# Patient Record
Sex: Female | Born: 1965 | Race: Black or African American | Hispanic: No | Marital: Single | Smoking: Former smoker
Health system: Southern US, Community
[De-identification: ages and names within clinical notes are randomized; demographics above are authoritative.]

## PROBLEM LIST (undated history)

## (undated) DIAGNOSIS — R7303 Prediabetes: Secondary | ICD-10-CM

## (undated) DIAGNOSIS — I82890 Acute embolism and thrombosis of other specified veins: Secondary | ICD-10-CM

## (undated) DIAGNOSIS — J45909 Unspecified asthma, uncomplicated: Secondary | ICD-10-CM

## (undated) DIAGNOSIS — T7840XA Allergy, unspecified, initial encounter: Secondary | ICD-10-CM

## (undated) DIAGNOSIS — E559 Vitamin D deficiency, unspecified: Secondary | ICD-10-CM

## (undated) DIAGNOSIS — O2230 Deep phlebothrombosis in pregnancy, unspecified trimester: Secondary | ICD-10-CM

## (undated) DIAGNOSIS — R0683 Snoring: Secondary | ICD-10-CM

## (undated) DIAGNOSIS — Z9289 Personal history of other medical treatment: Secondary | ICD-10-CM

## (undated) DIAGNOSIS — I82409 Acute embolism and thrombosis of unspecified deep veins of unspecified lower extremity: Secondary | ICD-10-CM

## (undated) DIAGNOSIS — E663 Overweight: Secondary | ICD-10-CM

## (undated) DIAGNOSIS — Z8619 Personal history of other infectious and parasitic diseases: Secondary | ICD-10-CM

## (undated) HISTORY — DX: Acute embolism and thrombosis of unspecified deep veins of unspecified lower extremity: I82.409

## (undated) HISTORY — DX: Vitamin D deficiency, unspecified: E55.9

## (undated) HISTORY — PX: TUBAL LIGATION: SHX77

## (undated) HISTORY — DX: Overweight: E66.3

## (undated) HISTORY — DX: Allergy, unspecified, initial encounter: T78.40XA

## (undated) HISTORY — DX: Personal history of other medical treatment: Z92.89

## (undated) HISTORY — DX: Personal history of other infectious and parasitic diseases: Z86.19

## (undated) HISTORY — DX: Deep phlebothrombosis in pregnancy, unspecified trimester: O22.30

## (undated) HISTORY — DX: Unspecified asthma, uncomplicated: J45.909

## (undated) HISTORY — DX: Prediabetes: R73.03

## (undated) HISTORY — DX: Acute embolism and thrombosis of other specified veins: I82.890

## (undated) HISTORY — DX: Snoring: R06.83

---

## 1996-08-01 DIAGNOSIS — O2230 Deep phlebothrombosis in pregnancy, unspecified trimester: Secondary | ICD-10-CM

## 1996-08-01 HISTORY — DX: Deep phlebothrombosis in pregnancy, unspecified trimester: O22.30

## 2007-08-02 LAB — HM MAMMOGRAPHY: HM MAMMO: NORMAL

## 2012-08-03 LAB — LIPID PANEL
Cholesterol: 154 mg/dL (ref 0–200)
HDL: 51 mg/dL (ref 35–70)
LDL CALC: 92 mg/dL
TRIGLYCERIDES: 53 mg/dL (ref 40–160)

## 2012-08-03 LAB — HM PAP SMEAR: HM PAP: NORMAL

## 2014-03-01 DIAGNOSIS — I82409 Acute embolism and thrombosis of unspecified deep veins of unspecified lower extremity: Secondary | ICD-10-CM

## 2014-03-01 HISTORY — DX: Acute embolism and thrombosis of unspecified deep veins of unspecified lower extremity: I82.409

## 2014-03-21 ENCOUNTER — Emergency Department: Payer: Self-pay | Admitting: Student

## 2014-03-21 LAB — CBC
HCT: 40.5 % (ref 35.0–47.0)
HGB: 13 g/dL (ref 12.0–16.0)
MCH: 29.5 pg (ref 26.0–34.0)
MCHC: 32.1 g/dL (ref 32.0–36.0)
MCV: 92 fL (ref 80–100)
PLATELETS: 159 10*3/uL (ref 150–440)
RBC: 4.4 10*6/uL (ref 3.80–5.20)
RDW: 13.1 % (ref 11.5–14.5)
WBC: 6.2 10*3/uL (ref 3.6–11.0)

## 2014-03-21 LAB — APTT: Activated PTT: 23.1 secs — ABNORMAL LOW (ref 23.6–35.9)

## 2014-03-21 LAB — PROTIME-INR
INR: 1
Prothrombin Time: 12.9 secs (ref 11.5–14.7)

## 2014-03-28 ENCOUNTER — Ambulatory Visit: Payer: Self-pay | Admitting: Vascular Surgery

## 2014-03-28 LAB — BUN: BUN: 14 mg/dL (ref 7–18)

## 2014-03-28 LAB — CREATININE, SERUM
Creatinine: 0.84 mg/dL (ref 0.60–1.30)
EGFR (Non-African Amer.): >60

## 2014-03-28 LAB — PROTIME-INR
INR: 1.1
Prothrombin Time: 13.8 secs (ref 11.5–14.7)

## 2014-04-01 DIAGNOSIS — Z9289 Personal history of other medical treatment: Secondary | ICD-10-CM

## 2014-04-01 HISTORY — DX: Personal history of other medical treatment: Z92.89

## 2014-04-01 HISTORY — PX: INSERTION OF VENA CAVA FILTER: SHX5871

## 2014-04-24 ENCOUNTER — Ambulatory Visit: Payer: Self-pay | Admitting: Internal Medicine

## 2014-05-01 ENCOUNTER — Ambulatory Visit: Payer: Self-pay | Admitting: Internal Medicine

## 2014-07-09 ENCOUNTER — Ambulatory Visit: Payer: Self-pay | Admitting: Internal Medicine

## 2014-07-09 LAB — HEPATIC FUNCTION PANEL A (ARMC)
ALBUMIN: 3.7 g/dL (ref 3.4–5.0)
ALK PHOS: 60 U/L
ALT: 22 U/L
Bilirubin, Direct: <0.1 mg/dL (ref 0.0–0.2)
Bilirubin,Total: 0.2 mg/dL (ref 0.2–1.0)
SGOT(AST): 24 U/L (ref 15–37)
TOTAL PROTEIN: 7.4 g/dL (ref 6.4–8.2)

## 2014-07-09 LAB — CREATININE, SERUM
CREATININE: 0.84 mg/dL (ref 0.60–1.30)
EGFR (African American): >60
EGFR (Non-African Amer.): >60

## 2014-07-09 LAB — CBC CANCER CENTER
BASOS PCT: 1.1 %
Basophil #: 0.1 x10 3/mm (ref 0.0–0.1)
EOS ABS: 0.1 x10 3/mm (ref 0.0–0.7)
Eosinophil %: 2.3 %
HCT: 38 % (ref 35.0–47.0)
HGB: 11.9 g/dL — AB (ref 12.0–16.0)
LYMPHS PCT: 42.3 %
Lymphocyte #: 2.6 x10 3/mm (ref 1.0–3.6)
MCH: 28.6 pg (ref 26.0–34.0)
MCHC: 31.4 g/dL — ABNORMAL LOW (ref 32.0–36.0)
MCV: 91 fL (ref 80–100)
Monocyte #: 0.5 x10 3/mm (ref 0.2–0.9)
Monocyte %: 8.4 %
NEUTROS PCT: 45.9 %
Neutrophil #: 2.9 x10 3/mm (ref 1.4–6.5)
PLATELETS: 227 x10 3/mm (ref 150–440)
RBC: 4.17 10*6/uL (ref 3.80–5.20)
RDW: 13.5 % (ref 11.5–14.5)
WBC: 6.2 x10 3/mm (ref 3.6–11.0)

## 2014-07-09 LAB — HCG, QUANTITATIVE, PREGNANCY: BETA HCG, QUANT.: 1 m[IU]/mL

## 2014-08-01 ENCOUNTER — Ambulatory Visit: Payer: Self-pay | Admitting: Internal Medicine

## 2014-09-11 ENCOUNTER — Ambulatory Visit: Payer: Self-pay | Admitting: Vascular Surgery

## 2014-09-22 ENCOUNTER — Ambulatory Visit: Payer: Self-pay | Admitting: Internal Medicine

## 2014-11-22 NOTE — Op Note (Signed)
PATIENT NAME:  [name] [name] MR#:  [id number] DATE OF BIRTH:  1965/09/02  DATE OF PROCEDURE:  03/28/2014  PREOPERATIVE DIAGNOSES: 1.  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  2.  History of deep venous thrombosis with pregnancy in the past.   POSTOPERATIVE DIAGNOSES:   1.  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  2.  History of deep venous thrombosis with pregnancy in the past.   PROCEDURES: 1.  Ultrasound guidance for vascular access to right femoral vein.  2.  Placement of inferior vena cava filter, Bard Denali type.  3.  Ultrasound guidance for vascular access to left popliteal vein.  4.  Catheter placement into inferior vena cava from the left popliteal vein.  5.  Left lower extremity venogram and central venogram, and IVC gram.  6.  Catheter directed thrombolysis with 8 mg of t-PA delivered with the AngioJet proxy catheter.  7.  Mechanical rheolytic thrombectomy to the left superficial femoral vein, common femoral vein, external iliac vein, and common iliac vein with AngioJet proxy catheter.  8.  Percutaneous transluminal angioplasty of left common femoral vein, and superficial femoral vein with 10 mm diameter angioplasty balloon.  9.  Percutaneous transluminal angioplasty of left external iliac vein, and common iliac vein with 10 and 12 mm diameter angioplasty balloon.   SURGEON:  Annice NeedyJason S Dew, MD   ANESTHESIA:  Local with moderate conscious sedation.   ESTIMATED BLOOD LOSS:  Minimal.   INDICATION FOR PROCEDURE:  This is a [name], healthy woman with a recent diagnosis of DVT. She was diagnosed less than 1 week ago. She was started on appropriate anticoagulation; however, her symptoms progressed and a repeat ultrasound 3 days ago was shown to show propagation of the DVT now up into the common femoral vein, and likely higher, whereas this has previously not been up to the common femoral vein.  For this reason, she is brought in for aggressive treatment of her venous disease given her [name] age, and limited comorbidities. We will also place an IVC filter due to propagation of the deep vein thrombosis, and the planned placement of thrombolysis and thrombectomy.   DESCRIPTION OF PROCEDURE:  The patient is brought to the vascular suite. Groins were shaved and prepped, and a sterile surgical field was created in the right femoral vein, this was visualized with ultrasound. It was accessed with a mild amount of difficulty with a micropuncture needle and micropuncture wire, and sheath were then placed. We upsized to the delivery sheath [facility] the [facility], this was placed into the inferior vena cava. An inferior venacavogram was performed. This showed a patent vena cava with the level of the renal veins at L1. The filter was then deployed at the top of L2.   I then turned my attention to the thrombolysis thrombectomy of left lower extremity. She was placed in a prone position, her left popliteal vein was visualized with ultrasound, and found to be patent. It was then accessed under ultrasound guidance with a micropuncture needle, and micropuncture wire, and a 6 French sheath was placed. Imaging was performed through this. The popliteal vein was patent up to near the level of Hunter canal where a thrombus was seen in the superficial femoral vein that extended up into the common femoral vein into the iliac veins. We were able to navigate through this with a Kumpe catheter, and a Magic torque wire. 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 There was thrombus in this area as well, but the stenosis was likely part the cause of the situation, 8 mg of t-PA were delivered with the AngioJet proxy catheter throughout the superficial femoral vein, common femoral vein, external iliac vein, and  common iliac vein up into the vena cava. This was allowed to dwell for 20 minutes. Mechanical rheolytic thrombectomy was then performed throughout the same veins which resulted in a marked improvement but not resolution of the thrombus. 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   I then used a 10 mm diameter angioplasty balloon. I used this to treat the common iliac vein. The external iliac vein and then for the residual thrombus and separate stenosis in the common femoral vein and superficial femoral vein it was pulled down, and treated these areas as well, separately; the SFV, and common femoral vein were markedly improved with the 10 mm balloon. There was still narrowing within the iliac veins, and so I upsized to a 12 mm diameter angioplasty balloon. With this, there was significant improvement. The lumen was still narrowed, probably 40% to 50%, but the flow was markedly improved. I elected not to place a stent stay in the acute thrombotic situation, and we will see how her symptoms resolve with anticoagulation. The sheath was removed. Pressure was held. Sterile dressing was placed. The patient tolerated the procedure well, and was taken to the recovery room in stable condition.    ____________________________ Annice Needy, MD jsd:nt D: 03/28/2014 11:38:42 ET T: 03/28/2014 15:03:54 ET JOB#: [id number]  cc: Annice Needy, MD, <Dictator> Annice Needy MD ELECTRONICALLY SIGNED 03/31/2014 11:50

## 2014-11-30 NOTE — Op Note (Signed)
PATIENT NAME:  [name] EgeYOUNG  [name], [name] [name] MR#:  [id number] DATE OF BIRTH:  1966-01-23  DATE OF PROCEDURE:  09/11/2014  PREOPERATIVE DIAGNOSES:  1. Extensive left lower extremity deep vein thrombosis.  2. Status post inferior vena cava filter and venous thrombolysis left lower extremity.   POSTOPERATIVE DIAGNOSIS:  1. Extensive left lower extremity deep vein thrombosis. 2. Status post inferior vena cava filter and venous thrombolysis left lower extremity.  PROCEDURES: 1. Ultrasound guidance for vascular access to right jugular vein.  2. Catheter placement into inferior vena cava from right jugular vein approach.  3. Inferior venacavogram.  4. [facility]al of [facility] IV[facility] filter.   SURGEON: Annice NeedyJason S. Aparna Vanderweele, MD   ANESTHESIA: Local with moderate conscious sedation.   ESTIMATED BLOOD LOSS: Minimal.   FLUOROSCOPY TIME: About 2 minutes and 15 mL of contrast were used.   INDICATION FOR PROCEDURE: A 49 year old female, who we placed a filter as part of a thrombolysis for extensive left lower extremity deep vein thrombosis a couple of months ago. She has done well. Her leg is doing great. She has only chronic-appearing thrombus residual and we are now here to get her filter out. Risks and benefits were discussed. Informed consent was obtained.   DESCRIPTION OF PROCEDURE: The patient is brought to the vascular suite. The right neck was sterilely prepped and draped and a sterile surgical field was created. The right jugular vein was visualized with ultrasound and found to be widely patent. It was then accessed under direct ultrasound guidance without difficulty with Seldinger needle. J-wire and a permanent image was recorded.  A J-wire was then placed. After skin nick and dilatation, the retrieval sheath was placed over the wire into the inferior vena cava. Inferior venacavogram was performed, which showed the cava to be widely patent with filter in good location below the renal veins. The retrieval snare  was then used to snare the hook at the top of the filter without difficulty. The sheath was advanced over the filter. The filter was collapsed, brought out in its entirety through the sheath. The sheath was then removed. Pressure was held. Sterile dressings were placed. The patient tolerated the procedure well and was taken to the recovery room in stable condition.   ____________________________ Annice NeedyJason S. Dechelle Attaway, MD jsd:ap D: 09/11/2014 11:26:25 ET T: 09/11/2014 16:15:36 ET JOB#: [id number]  cc: Annice NeedyJason S. Tanairi Cypert, MD, <Dictator> Annice NeedyJASON S Airyana Sprunger MD ELECTRONICALLY SIGNED 09/24/2014 16:36

## 2015-03-13 ENCOUNTER — Inpatient Hospital Stay: Payer: Self-pay | Attending: Family Medicine | Admitting: Family Medicine

## 2015-03-13 ENCOUNTER — Ambulatory Visit: Payer: Self-pay | Admitting: Family Medicine

## 2015-04-09 ENCOUNTER — Ambulatory Visit: Payer: Self-pay | Admitting: Family Medicine

## 2015-04-21 ENCOUNTER — Encounter: Payer: Self-pay | Admitting: *Deleted

## 2015-04-22 ENCOUNTER — Inpatient Hospital Stay: Payer: Self-pay | Admitting: Internal Medicine

## 2015-04-22 ENCOUNTER — Ambulatory Visit: Payer: Self-pay

## 2015-04-22 ENCOUNTER — Other Ambulatory Visit: Payer: Self-pay

## 2015-04-23 ENCOUNTER — Telehealth: Payer: Self-pay | Admitting: *Deleted

## 2015-04-23 NOTE — Telephone Encounter (Signed)
-----   Message from [name] [name] sent at 04/22/2015  3:17 PM EDT ----- Called patient to r/s missed appt today,and patient stated she did not want to be rescheduled anymore. Stated she came for a one time deal to se Dr Lorre Nick for a blood clot.

## 2015-04-23 NOTE — Telephone Encounter (Signed)
Patient does not want to r/s her appointment.

## 2015-05-11 NOTE — Progress Notes (Signed)
This encounter was created in error - please disregard.

## 2015-07-02 ENCOUNTER — Ambulatory Visit
Admission: RE | Admit: 2015-07-02 | Discharge: 2015-07-02 | Disposition: A | Discharge status: Home / Self Care | Payer: 59 | Source: Ambulatory Visit | Attending: Family Medicine | Admitting: Family Medicine

## 2015-07-02 ENCOUNTER — Encounter: Payer: Self-pay | Admitting: Family Medicine

## 2015-07-02 ENCOUNTER — Other Ambulatory Visit: Payer: Self-pay | Admitting: Family Medicine

## 2015-07-02 ENCOUNTER — Ambulatory Visit (INDEPENDENT_AMBULATORY_CARE_PROVIDER_SITE_OTHER): Payer: 59 | Admitting: Family Medicine

## 2015-07-02 VITALS — BP 140/90 | HR 100 | Temp 98.6°F | Resp 16 | Ht 65.0 in | Wt 166.2 lb

## 2015-07-02 DIAGNOSIS — J4551 Severe persistent asthma with (acute) exacerbation: Secondary | ICD-10-CM | POA: Diagnosis not present

## 2015-07-02 DIAGNOSIS — J4521 Mild intermittent asthma with (acute) exacerbation: Secondary | ICD-10-CM

## 2015-07-02 DIAGNOSIS — J309 Allergic rhinitis, unspecified: Secondary | ICD-10-CM

## 2015-07-02 DIAGNOSIS — J302 Other seasonal allergic rhinitis: Secondary | ICD-10-CM | POA: Insufficient documentation

## 2015-07-02 DIAGNOSIS — Z86718 Personal history of other venous thrombosis and embolism: Secondary | ICD-10-CM | POA: Insufficient documentation

## 2015-07-02 DIAGNOSIS — J3089 Other allergic rhinitis: Secondary | ICD-10-CM

## 2015-07-02 DIAGNOSIS — R072 Precordial pain: Secondary | ICD-10-CM | POA: Diagnosis not present

## 2015-07-02 DIAGNOSIS — E876 Hypokalemia: Secondary | ICD-10-CM

## 2015-07-02 DIAGNOSIS — Z13220 Encounter for screening for lipoid disorders: Secondary | ICD-10-CM | POA: Diagnosis not present

## 2015-07-02 DIAGNOSIS — J45909 Unspecified asthma, uncomplicated: Secondary | ICD-10-CM | POA: Insufficient documentation

## 2015-07-02 DIAGNOSIS — IMO0001 Reserved for inherently not codable concepts without codable children: Secondary | ICD-10-CM

## 2015-07-02 MED ORDER — LORATADINE 10 MG PO TABS: 10.0000 mg | ORAL_TABLET | Freq: Every day | ORAL | Status: AC

## 2015-07-02 MED ORDER — ALBUTEROL SULFATE HFA 108 (90 BASE) MCG/ACT IN AERS: 2.0000 | INHALATION_SPRAY | Freq: Four times a day (QID) | RESPIRATORY_TRACT | Status: DC | PRN

## 2015-07-02 MED ORDER — DEXAMETHASONE SODIUM PHOSPHATE 100 MG/10ML IJ SOLN: 8.0000 mg | Freq: Once | INTRAMUSCULAR | Status: DC

## 2015-07-02 MED ORDER — LEVOFLOXACIN 500 MG PO TABS: 500.0000 mg | ORAL_TABLET | Freq: Every day | ORAL | Status: DC

## 2015-07-02 MED ORDER — MONTELUKAST SODIUM 10 MG PO TABS: 10.0000 mg | ORAL_TABLET | Freq: Every day | ORAL | Status: DC

## 2015-07-02 MED ORDER — FLUTICASONE FUROATE-VILANTEROL 100-25 MCG/INH IN AEPB: 1.0000 | INHALATION_SPRAY | Freq: Every day | RESPIRATORY_TRACT | Status: DC

## 2015-07-02 MED ORDER — POTASSIUM CHLORIDE CRYS ER 20 MEQ PO TBCR: 20.0000 meq | EXTENDED_RELEASE_TABLET | Freq: Every day | ORAL | Status: DC

## 2015-07-02 MED ORDER — PREDNISONE 20 MG PO TABS: 20.0000 mg | ORAL_TABLET | Freq: Two times a day (BID) | ORAL | Status: DC

## 2015-07-02 NOTE — Progress Notes (Signed)
Name: [name] [name]   MRN: [id number]    DOB: [birth year]   Date:07/02/2015       Progress Note  Subjective  Chief Complaint  Chief Complaint  Patient presents with  . Asthma    patient states she had a cold around 06/25/15. Current symptoms cough and trouble breathing. Onset of trouble with breathing started last night.    HPI  Asthma Exacerbation : she developed allergy symptoms 5 days ago when driving climate while [place] from [place].  She states symptoms or mostly sneezing and mild dry cough and post-nasal drainage. Last night she developed left side chest pain/pressure radiating to her left clavicle, SOB, severe coughing spells with wheezing, and chest tightness. She has been using albuterol every 2 hours, with improvement of symptoms, but had to use very two hours.   AR perennial with  Seasonal variation: usually Spring and Fall and caused asthma flare  Patient Active Problem List   Diagnosis Date Noted  . Moderate intermittent asthma with acute exacerbation 07/02/2015  . Allergic rhinitis, seasonal 07/02/2015  . History of DVT of lower extremity     Past Surgical History  Procedure Laterality Date  . Insertion of vena cava filter  04/2014    Dr. Wyn Quakerew   . Tubal ligation      Family History  Problem Relation Age of Onset  . Leukemia Son 3    Social History   Social History  . Marital Status: Single    Spouse Name: N/A  . Number of Children: N/A  . Years of Education: N/A   Occupational History  . Not on file.   Social History Main Topics  . Smoking status: Former Smoker -- 10 years    Types: Cigarettes  . Smokeless tobacco: Not on file  . Alcohol Use: Not on file  . Drug Use: Not on file  . Sexual Activity: Not on file   Other Topics Concern  . Not on file   Social History Narrative     Current outpatient prescriptions:  .  albuterol (PROVENTIL HFA;VENTOLIN HFA) 108 (90 BASE) MCG/ACT inhaler, Inhale 2 puffs into the lungs every 6 (six)  hours as needed for wheezing or shortness of breath., Disp: 1 Inhaler, Rfl: 0 .  Fluticasone Furoate-Vilanterol (BREO ELLIPTA) 100-25 MCG/INH AEPB, Inhale 1 puff into the lungs daily., Disp: 60 each, Rfl: 2 .  loratadine (CLARITIN) 10 MG tablet, Take 1 tablet (10 mg total) by mouth daily., Disp: 30 tablet, Rfl: 5 .  montelukast (SINGULAIR) 10 MG tablet, Take 1 tablet (10 mg total) by mouth at bedtime., Disp: 30 tablet, Rfl: 5  Current facility-administered medications:  .  dexamethasone (DECADRON) injection 8 mg, 8 mg, Intramuscular, Once, Alba CoryKrichna Raenell Mensing, MD  Allergies  Allergen Reactions  . Shellfish Allergy Hives and Itching     ROS  Ten systems reviewed and is negative except as mentioned in HPI   Objective  Filed Vitals:   07/02/15 1001  BP: 140/90  Pulse: 100  Temp: 98.6 F (37 C)  TempSrc: Oral  Resp: 16  Height: 5\' 5"  (1.651 m)  Weight: 166 lb 3.2 oz (75.388 kg)  SpO2: 87%    Body mass index is 27.66 kg/(m^2).  Physical Exam  Constitutional: Patient appears well-developed and well-nourished. Obese  No distress.  HEENT: head atraumatic, normocephalic, pupils equal and reactive to light, ears TM, neck supple, throat within normal limits Cardiovascular: Normal rate, regular rhythm and normal heart sounds.  No murmur heard. No BLE edema.  Pulmonary/Chest: Effort normal, decrease in air movement on left side, that improved after nebulizer therapy, no crackles, no wheezing Abdominal: Soft.  There is no tenderness. Psychiatric: Patient has a normal mood and affect. behavior is normal. Judgment and thought content normal.  PHQ2/9: Depression screen PHQ 2/9 07/02/2015  Decreased Interest 0  Down, Depressed, Hopeless 0  PHQ - 2 Score 0     Fall Risk: Fall Risk  07/02/2015  Falls in the past year? No    Functional Status Survey: Is the patient deaf or have difficulty hearing?: No Does the patient have difficulty seeing, even when wearing glasses/contacts?: Yes  (contacts) Does the patient have difficulty concentrating, remembering, or making decisions?: No Does the patient have difficulty walking or climbing stairs?: No Does the patient have difficulty dressing or bathing?: No Does the patient have difficulty doing errands alone such as visiting a doctor's office or shopping?: No   Assessment & Plan  1. Moderate intermittent asthma with acute exacerbation  - dexamethasone (DECADRON) injection 8 mg; Inject 0.8 mLs (8 mg total) into the muscle once. Report from CXR showed possible infiltration versus mass on right lung field, we will start Levaquin ( patient notified by phone ) and needs to return in one month for repeat CXR - PR DEMO &/OR EVAL,PT USE,AEROSOL DEVICE - DG Chest 2 View; Future - CBC with Differential/Platelet - albuterol (PROVENTIL HFA;VENTOLIN HFA) 108 (90 BASE) MCG/ACT inhaler; Inhale 2 puffs into the lungs every 6 (six) hours as needed for wheezing or shortness of breath.  Dispense: 1 Inhaler; Refill: 0 - montelukast (SINGULAIR) 10 MG tablet; Take 1 tablet (10 mg total) by mouth at bedtime.  Dispense: 30 tablet; Refill: 5 - Fluticasone Furoate-Vilanterol (BREO ELLIPTA) 100-25 MCG/INH AEPB; Inhale 1 puff into the lungs daily.  Dispense: 60 each; Refill: 2 - predniSONE (DELTASONE) 20 MG tablet; Take 1 tablet (20 mg total) by mouth 2 (two) times daily with a meal.  Dispense: 10 tablet; Refill: 0  2. Precordial pain  - EKG 12-Lead - Troponin I - CK total and CKMB (cardiac)not at [facility] - Comprehensive metabolic panel  3. Allergic rhinitis, seasonal  - loratadine (CLARITIN) 10 MG tablet; Take 1 tablet (10 mg total) by mouth daily.  Dispense: 30 tablet; Refill: 5  4. Lipid screening  - Lipid panel

## 2015-07-03 ENCOUNTER — Telehealth: Payer: Self-pay | Admitting: Family Medicine

## 2015-07-03 LAB — CBC WITH DIFFERENTIAL/PLATELET
BASOS: 3 %
Basophils Absolute: 0.2 10*3/uL (ref 0.0–0.2)
EOS (ABSOLUTE): 0.5 10*3/uL — AB (ref 0.0–0.4)
Eos: 9 %
HEMOGLOBIN: 12.2 g/dL (ref 11.1–15.9)
Hematocrit: 36.2 % (ref 34.0–46.6)
IMMATURE GRANS (ABS): 0 10*3/uL (ref 0.0–0.1)
Immature Granulocytes: 0 %
Lymphocytes Absolute: 2 10*3/uL (ref 0.7–3.1)
Lymphs: 38 %
MCH: 29.3 pg (ref 26.6–33.0)
MCHC: 33.7 g/dL (ref 31.5–35.7)
MCV: 87 fL (ref 79–97)
Monocytes Absolute: 0.5 10*3/uL (ref 0.1–0.9)
Monocytes: 9 %
NEUTROS ABS: 2.1 10*3/uL (ref 1.4–7.0)
Neutrophils: 41 %
PLATELETS: 225 10*3/uL (ref 150–379)
RBC: 4.16 x10E6/uL (ref 3.77–5.28)
RDW: 13.3 % (ref 12.3–15.4)
WBC: 5.3 10*3/uL (ref 3.4–10.8)

## 2015-07-03 LAB — COMPREHENSIVE METABOLIC PANEL
ALK PHOS: 51 IU/L (ref 39–117)
ALT: 14 IU/L (ref 0–32)
AST: 20 IU/L (ref 0–40)
Albumin/Globulin Ratio: 1.5 (ref 1.1–2.5)
Albumin: 4.3 g/dL (ref 3.5–5.5)
BUN/Creatinine Ratio: 13 (ref 9–23)
BUN: 11 mg/dL (ref 6–24)
Bilirubin Total: 0.5 mg/dL (ref 0.0–1.2)
CALCIUM: 9.1 mg/dL (ref 8.7–10.2)
CO2: 24 mmol/L (ref 18–29)
CREATININE: 0.86 mg/dL (ref 0.57–1.00)
Chloride: 98 mmol/L (ref 97–106)
GFR calc Af Amer: 92 mL/min/{1.73_m2} (ref >59)
GFR, EST NON AFRICAN AMERICAN: 80 mL/min/{1.73_m2} (ref >59)
GLUCOSE: 89 mg/dL (ref 65–99)
Globulin, Total: 2.8 g/dL (ref 1.5–4.5)
POTASSIUM: 3.2 mmol/L — AB (ref 3.5–5.2)
Sodium: 138 mmol/L (ref 136–144)
Total Protein: 7.1 g/dL (ref 6.0–8.5)

## 2015-07-03 LAB — LIPID PANEL
CHOL/HDL RATIO: 2.1 ratio (ref 0.0–4.4)
Cholesterol, Total: 160 mg/dL (ref 100–199)
HDL: 78 mg/dL (ref >39)
LDL CALC: 73 mg/dL (ref 0–99)
Triglycerides: 43 mg/dL (ref 0–149)
VLDL Cholesterol Cal: 9 mg/dL (ref 5–40)

## 2015-07-03 LAB — CK TOTAL AND CKMB (NOT AT ARMC)
CK TOTAL: 165 U/L (ref 24–173)
CK-MB Index: 2 ng/mL (ref 0.0–5.3)

## 2015-07-03 LAB — TROPONIN I: Troponin I: <0.01 ng/mL (ref 0.00–0.04)

## 2015-07-03 NOTE — Telephone Encounter (Signed)
Patient is requesting to have [facility]. Patient was seen in the office on yesterday and was told if she needed Tussinex to just call the office today.  Please call patient once done.

## 2015-07-04 MED ORDER — BENZONATATE 100 MG PO CAPS: 100.0000 mg | ORAL_CAPSULE | Freq: Three times a day (TID) | ORAL | Status: DC | PRN

## 2015-07-04 NOTE — Telephone Encounter (Signed)
Sent Benzonate, Tussionex is controlled, needs hard copy, can pick it up Monday if she would like

## 2015-07-07 ENCOUNTER — Telehealth: Payer: Self-pay | Admitting: Family Medicine

## 2015-07-07 MED ORDER — HYDROCOD POLST-CPM POLST ER 10-8 MG/5ML PO SUER: 5.0000 mL | Freq: Two times a day (BID) | ORAL | Status: DC | PRN

## 2015-07-07 NOTE — Telephone Encounter (Signed)
Pt called states she does need the Tussonex to be called in. Pt states the pearls do not work for her and was advised to call if she needed the cough syrup. [facility]

## 2015-07-07 NOTE — Telephone Encounter (Signed)
Controlled medication, she needs to come in to pick it up. Printed

## 2015-07-23 ENCOUNTER — Ambulatory Visit (INDEPENDENT_AMBULATORY_CARE_PROVIDER_SITE_OTHER): Payer: 59 | Admitting: Family Medicine

## 2015-07-23 ENCOUNTER — Encounter: Payer: Self-pay | Admitting: Family Medicine

## 2015-07-23 VITALS — BP 114/58 | HR 90 | Temp 99.3°F | Resp 18 | Wt 169.9 lb

## 2015-07-23 DIAGNOSIS — R9389 Abnormal findings on diagnostic imaging of other specified body structures: Secondary | ICD-10-CM

## 2015-07-23 DIAGNOSIS — R938 Abnormal findings on diagnostic imaging of other specified body structures: Secondary | ICD-10-CM

## 2015-07-23 DIAGNOSIS — R059 Cough, unspecified: Secondary | ICD-10-CM

## 2015-07-23 DIAGNOSIS — R05 Cough: Secondary | ICD-10-CM | POA: Diagnosis not present

## 2015-07-23 DIAGNOSIS — R0602 Shortness of breath: Secondary | ICD-10-CM

## 2015-07-23 MED ORDER — AMOXICILLIN-POT CLAVULANATE 875-125 MG PO TABS: 1.0000 | ORAL_TABLET | Freq: Two times a day (BID) | ORAL | Status: DC

## 2015-07-23 MED ORDER — HYDROCOD POLST-CPM POLST ER 10-8 MG/5ML PO SUER: 5.0000 mL | Freq: Two times a day (BID) | ORAL | Status: DC | PRN

## 2015-07-23 NOTE — Progress Notes (Signed)
Name: [name] [name]   MRN: [id number]    DOB: 01-24-1966   Date:07/23/2015       Progress Note  Subjective  Chief Complaint  Chief Complaint  Patient presents with  . Pneumonia    follow-up, patient states still having cough, chest congestion, tightness and coughing up small amounts of blood in phlem    HPI  Cough, SOB and abnormal CXR: she was seen three weeks ago with hypoxemia, a productive cough, she was treated with Levaquin, Breo, symptoms improved significantly but over the past week, started to have worsening of cough again, sometimes sputum has streaks of blood, still feels SOB even at rest but not as severe. She denies fever or chills. Appetite is normal .  Patient Active Problem List   Diagnosis Date Noted  . Moderate intermittent asthma with acute exacerbation 07/02/2015  . Perennial allergic rhinitis with seasonal variation 07/02/2015  . Hypokalemia 07/02/2015  . History of DVT of lower extremity     Past Surgical History  Procedure Laterality Date  . Insertion of vena cava filter  04/2014    Dr. Wyn Quaker   . Tubal ligation      Family History  Problem Relation Age of Onset  . Leukemia Son 3    Social History   Social History  . Marital Status: Single    Spouse Name: N/A  . Number of Children: N/A  . Years of Education: N/A   Occupational History  . Not on file.   Social History Main Topics  . Smoking status: Former Smoker -- 10 years    Types: Cigarettes  . Smokeless tobacco: Not on file  . Alcohol Use: Not on file  . Drug Use: Not on file  . Sexual Activity: Not on file   Other Topics Concern  . Not on file   Social History Narrative     Current outpatient prescriptions:  .  albuterol (PROVENTIL HFA;VENTOLIN HFA) 108 (90 BASE) MCG/ACT inhaler, Inhale 2 puffs into the lungs every 6 (six) hours as needed for wheezing or shortness of breath., Disp: 1 Inhaler, Rfl: 0 .  amoxicillin-clavulanate (AUGMENTIN) 875-125 MG tablet, Take 1 tablet  by mouth 2 (two) times daily., Disp: 20 tablet, Rfl: 0 .  chlorpheniramine-HYDROcodone (TUSSIONEX PENNKINETIC ER) 10-8 MG/5ML SUER, Take 5 mLs by mouth every 12 (twelve) hours as needed for cough., Disp: 140 mL, Rfl: 0 .  Fluticasone Furoate-Vilanterol (BREO ELLIPTA) 100-25 MCG/INH AEPB, Inhale 1 puff into the lungs daily., Disp: 60 each, Rfl: 2 .  loratadine (CLARITIN) 10 MG tablet, Take 1 tablet (10 mg total) by mouth daily., Disp: 30 tablet, Rfl: 5 .  montelukast (SINGULAIR) 10 MG tablet, Take 1 tablet (10 mg total) by mouth at bedtime., Disp: 30 tablet, Rfl: 5 .  potassium chloride SA (K-DUR,KLOR-CON) 20 MEQ tablet, Take 1 tablet (20 mEq total) by mouth daily., Disp: 10 tablet, Rfl: 0  Allergies  Allergen Reactions  . Shellfish Allergy Hives and Itching     ROS  Ten systems reviewed and is negative except as mentioned in HPI   Objective  Filed Vitals:   07/23/15 1555  BP: 114/58  Pulse: 90  Temp: 99.3 F (37.4 C)  TempSrc: Oral  Resp: 18  Weight: 169 lb 14.4 oz (77.066 kg)  SpO2: 97%    Body mass index is 28.27 kg/(m^2).  Physical Exam  Constitutional: Patient appears well-developed and well-nourished. No distress.  HEENT: head atraumatic, normocephalic, pupils equal and reactive to light, ear TM within normal  limits  neck supple, throat within normal limits Cardiovascular: Normal rate, regular rhythm and normal heart sounds.  No murmur heard. No BLE edema. Pulmonary/Chest: Effort normal and breath sounds normal. No respiratory distress. Abdominal: Soft.  There is no tenderness. Psychiatric: Patient has a normal mood and affect. behavior is normal. Judgment and thought content normal.  Recent Results (from the past 2160 hour(s))  Troponin I     Status: None   Collection Time: 07/02/15 11:21 AM  Result Value Ref Range   Troponin I <0.01 0.00 - 0.04 ng/mL  CBC with Differential/Platelet     Status: Abnormal   Collection Time: 07/02/15 11:21 AM  Result Value Ref  Range   WBC 5.3 3.4 - 10.8 x10E3/uL   RBC 4.16 3.77 - 5.28 x10E6/uL   Hemoglobin 12.2 11.1 - 15.9 g/dL   Hematocrit 19.136.2 47.834.0 - 46.6 %   MCV 87 79 - 97 fL   MCH 29.3 26.6 - 33.0 pg   MCHC 33.7 31.5 - 35.7 g/dL   RDW 29.513.3 62.112.3 - 30.815.4 %   Platelets 225 150 - 379 x10E3/uL   Neutrophils 41 %   Lymphs 38 %   Monocytes 9 %   Eos 9 %   Basos 3 %   Neutrophils Absolute 2.1 1.4 - 7.0 x10E3/uL   Lymphocytes Absolute 2.0 0.7 - 3.1 x10E3/uL   Monocytes Absolute 0.5 0.1 - 0.9 x10E3/uL   EOS (ABSOLUTE) 0.5 (H) 0.0 - 0.4 x10E3/uL   Basophils Absolute 0.2 0.0 - 0.2 x10E3/uL   Immature Granulocytes 0 %   Immature Grans (Abs) 0.0 0.0 - 0.1 x10E3/uL  CK total and CKMB (cardiac)not at [facility]     Status: None   Collection Time: 07/02/15 11:21 AM  Result Value Ref Range   Total CK 165 24 - 173 U/L   CK-MB Index 2.0 0.0 - 5.3 ng/mL  Lipid panel     Status: None   Collection Time: 07/02/15 11:21 AM  Result Value Ref Range   Cholesterol, Total 160 100 - 199 mg/dL   Triglycerides 43 0 - 149 mg/dL   HDL 78 >65>39 mg/dL   VLDL Cholesterol Cal 9 5 - 40 mg/dL   LDL Calculated 73 0 - 99 mg/dL   Chol/HDL Ratio 2.1 0.0 - 4.4 ratio units    Comment:                                   T. Chol/HDL Ratio                                             Men  Women                               1/2 Avg.Risk  3.4    3.3                                   Avg.Risk  5.0    4.4                                2X Avg.Risk  9.6    7.1  3X Avg.Risk 23.4   11.0   Comprehensive metabolic panel     Status: Abnormal   Collection Time: 07/02/15 11:21 AM  Result Value Ref Range   Glucose 89 65 - 99 mg/dL   BUN 11 6 - 24 mg/dL   Creatinine, Ser 1.61 0.57 - 1.00 mg/dL   GFR calc non Af Amer 80 >59 mL/min/1.73   GFR calc Af Amer 92 >59 mL/min/1.73   BUN/Creatinine Ratio 13 9 - 23   Sodium 138 136 - 144 mmol/L    Comment: **Effective July 13, 2015 the reference interval**   for Sodium, Serum will  be changing to:                                             134 - 144    Potassium 3.2 (L) 3.5 - 5.2 mmol/L   Chloride 98 97 - 106 mmol/L    Comment: **Effective July 13, 2015 the reference interval**   for Chloride, Serum will be changing to:                                              96 - 106    CO2 24 18 - 29 mmol/L   Calcium 9.1 8.7 - 10.2 mg/dL   Total Protein 7.1 6.0 - 8.5 g/dL   Albumin 4.3 3.5 - 5.5 g/dL   Globulin, Total 2.8 1.5 - 4.5 g/dL   Albumin/Globulin Ratio 1.5 1.1 - 2.5   Bilirubin Total 0.5 0.0 - 1.2 mg/dL   Alkaline Phosphatase 51 39 - 117 IU/L   AST 20 0 - 40 IU/L   ALT 14 0 - 32 IU/L     PHQ2/9: Depression screen PHQ 2/9 07/02/2015  Decreased Interest 0  Down, Depressed, Hopeless 0  PHQ - 2 Score 0     Fall Risk: Fall Risk  07/02/2015  Falls in the past year? No     Assessment & Plan  1. Cough  Possible recurrence of CAP, but she had a possible mass on right lower lung field and cough has been going on for one month approximately we will check CT scan and start antibiotic - chlorpheniramine-HYDROcodone (TUSSIONEX PENNKINETIC ER) 10-8 MG/5ML SUER; Take 5 mLs by mouth every 12 (twelve) hours as needed for cough.  Dispense: 140 mL; Refill: 0 - CT Chest Wo Contrast; Future - amoxicillin-clavulanate (AUGMENTIN) 875-125 MG tablet; Take 1 tablet by mouth 2 (two) times daily.  Dispense: 20 tablet; Refill: 0  2. Abnormal CXR  - CT Chest Wo Contrast; Future  3. SOB (shortness of breath)  - CT Chest Wo Contrast; Future

## 2015-07-31 ENCOUNTER — Ambulatory Visit
Admission: RE | Admit: 2015-07-31 | Discharge: 2015-07-31 | Disposition: A | Discharge status: Home / Self Care | Payer: 59 | Source: Ambulatory Visit | Attending: Family Medicine | Admitting: Family Medicine

## 2015-07-31 DIAGNOSIS — R059 Cough, unspecified: Secondary | ICD-10-CM

## 2015-07-31 DIAGNOSIS — R0602 Shortness of breath: Secondary | ICD-10-CM | POA: Diagnosis present

## 2015-07-31 DIAGNOSIS — R05 Cough: Secondary | ICD-10-CM | POA: Diagnosis present

## 2015-07-31 DIAGNOSIS — R938 Abnormal findings on diagnostic imaging of other specified body structures: Secondary | ICD-10-CM | POA: Insufficient documentation

## 2015-07-31 DIAGNOSIS — R9389 Abnormal findings on diagnostic imaging of other specified body structures: Secondary | ICD-10-CM

## 2015-07-31 IMAGING — CT CT CHEST W/O CM
2 of 3 series · 15 of 36 positions shown, 18 images · non-contrast
Comparison: None; correlation chest radiograph [DATE]

CLINICAL DATA: Diagnosed with pneumonia [DATE], shortness of
breath, could not breathe, improved with antibiotics but now having
shortness of breath and cough again, former smoker, history asthma

EXAM:
CT CHEST WITHOUT CONTRAST
TECHNIQUE: Multidetector CT imaging of the chest was performed following the
standard protocol without IV contrast.

[Series 2: routine chest wo · axial · 0.61mm/px · z∈[-628,-392]mm · 12 of 57 slices shown, 15 images]
[im 5/57  mediastinal]
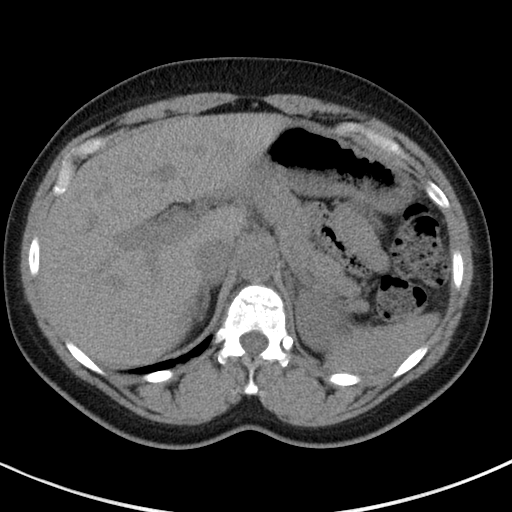
[im 5/57  lung]
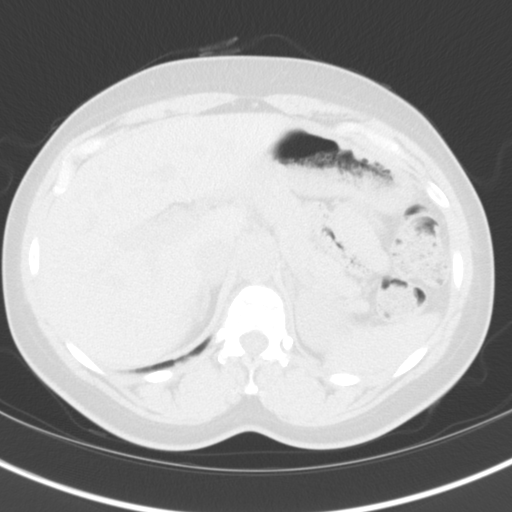
[im 9/57  lung]
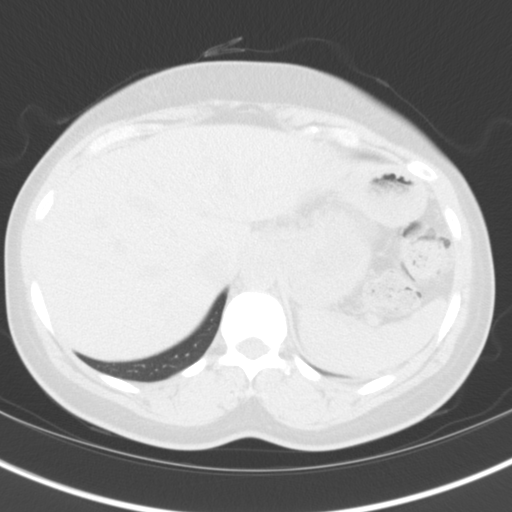
[im 13/57  lung]
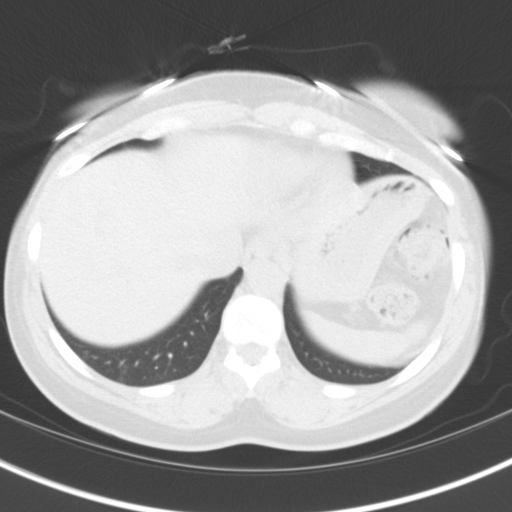
[im 17/57  lung]
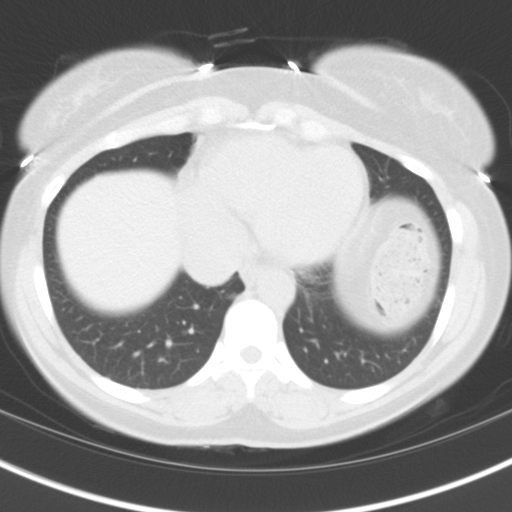
[im 21/57  mediastinal]
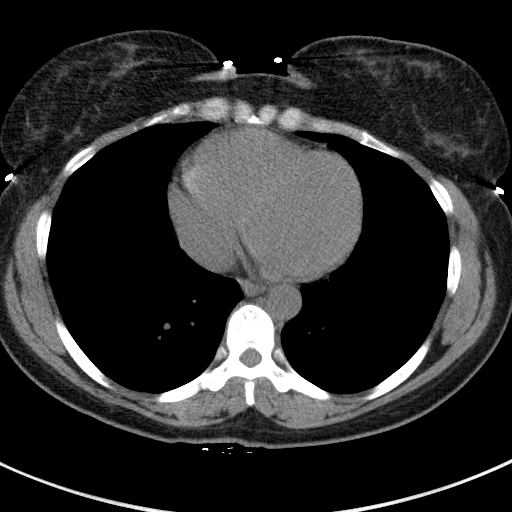
[im 21/57  lung]
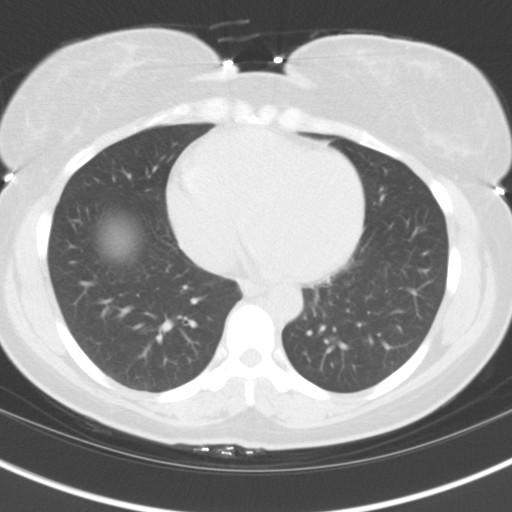
[im 25/57  lung]
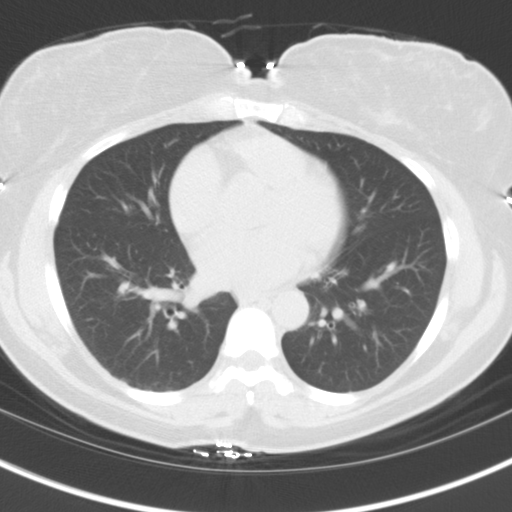
[im 32/57  lung]
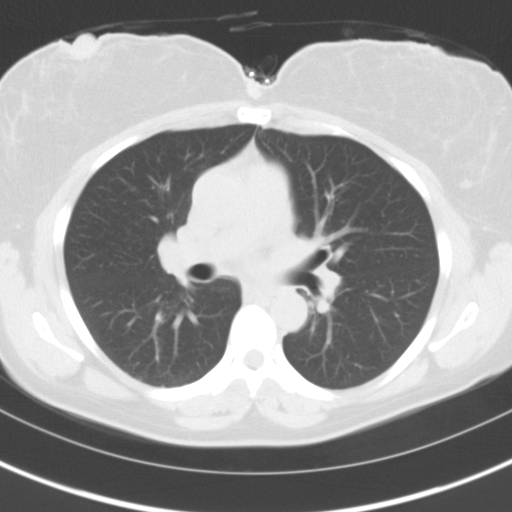
[im 36/57  lung]
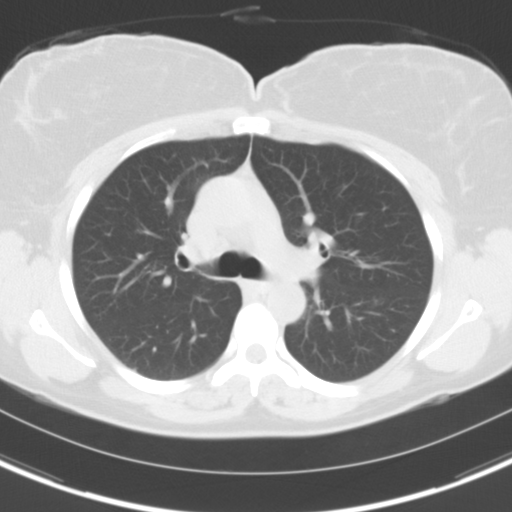
[im 40/57  mediastinal]
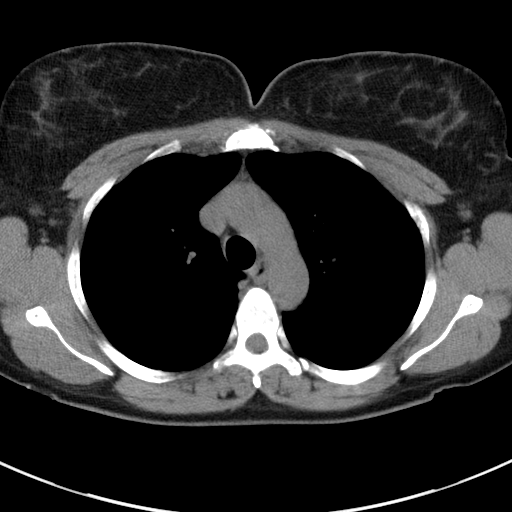
[im 40/57  lung]
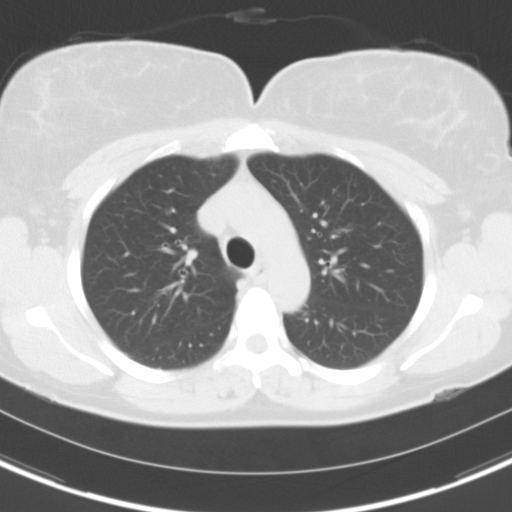
[im 44/57  lung]
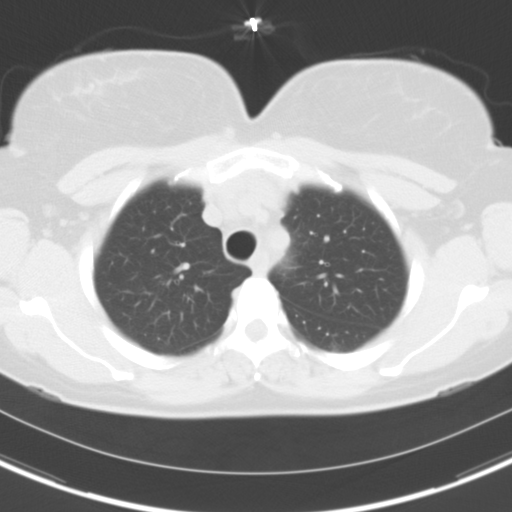
[im 48/57  lung]
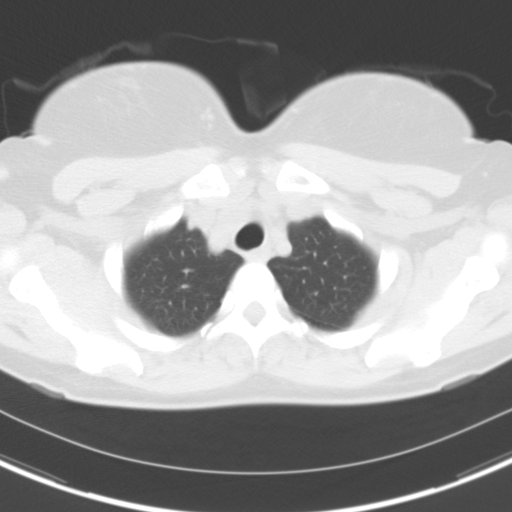
[im 52/57  lung]
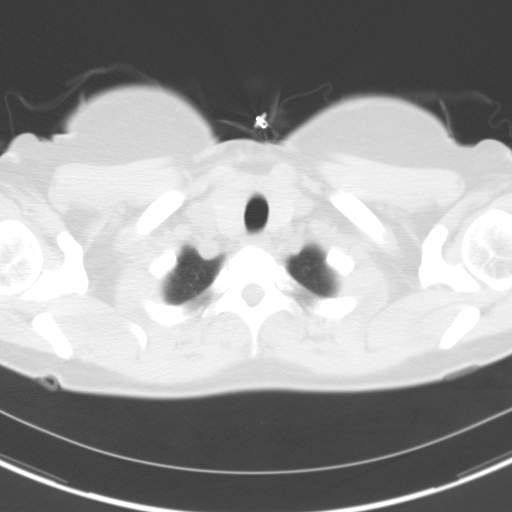

[Series 5: cor routine chest wo · coronal · 0.57mm/px · 3 of 133 slices shown]
[im 27/133  lung]
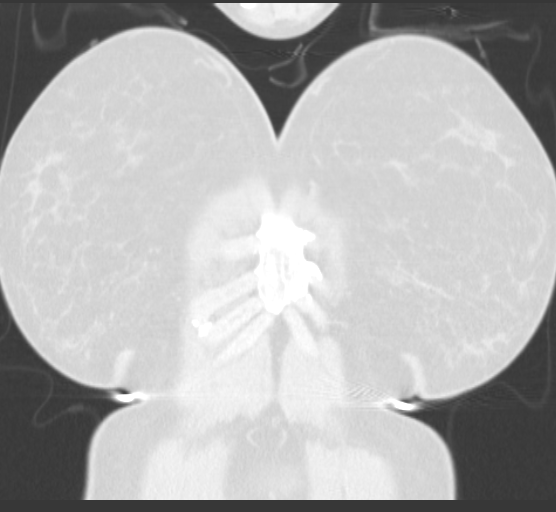
[im 53/133  lung]
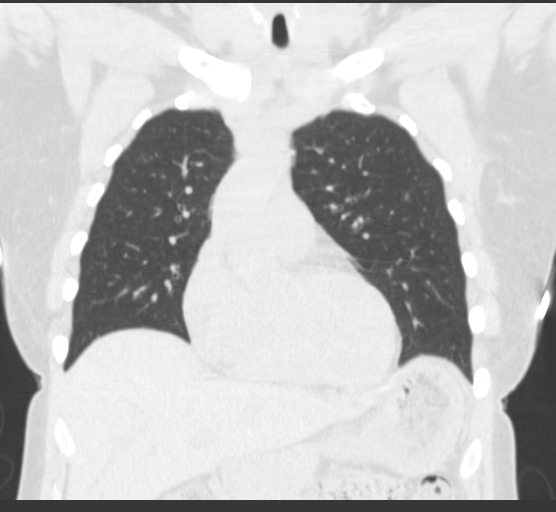
[im 80/133  lung]
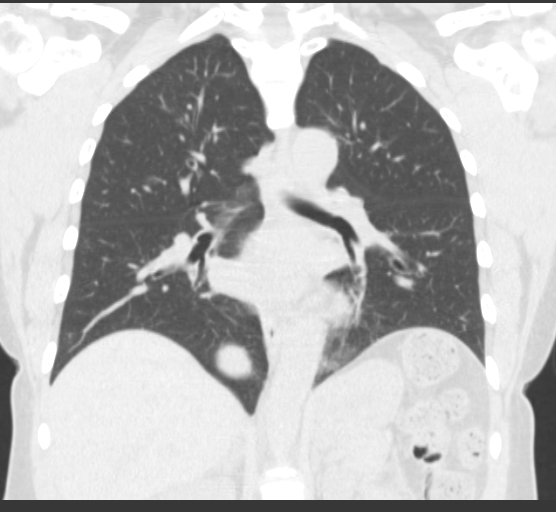

[15 of 36 positions shown; findings below may reference images not displayed]

FINDINGS: Aorta normal caliber.

No thoracic adenopathy by noncontrast CT.

Upper normal size prevascular lymph node 9 mm short axis.

Normal sized 7 mm short axis RIGHT paratracheal lymph node image 21.

Visualized upper abdomen unremarkable for noncontrast technique.

Lungs clear.

No pulmonary infiltrate, pleural effusion, pneumothorax or definite
mass/nodule.

No acute osseous findings.
IMPRESSION: Normal exam.

## 2015-07-31 NOTE — Progress Notes (Signed)
Appointment made

## 2015-08-10 ENCOUNTER — Ambulatory Visit: Payer: 59 | Admitting: Family Medicine

## 2015-08-13 ENCOUNTER — Ambulatory Visit: Payer: 59 | Admitting: Family Medicine

## 2015-08-20 ENCOUNTER — Ambulatory Visit: Payer: 59 | Admitting: Family Medicine

## 2016-10-24 ENCOUNTER — Emergency Department: Payer: 59

## 2016-10-24 ENCOUNTER — Emergency Department
Admission: EM | Admit: 2016-10-24 | Discharge: 2016-10-24 | Disposition: A | Discharge status: Home / Self Care | Payer: 59 | Attending: Emergency Medicine | Admitting: Emergency Medicine

## 2016-10-24 DIAGNOSIS — M7989 Other specified soft tissue disorders: Secondary | ICD-10-CM | POA: Diagnosis present

## 2016-10-24 DIAGNOSIS — I82402 Acute embolism and thrombosis of unspecified deep veins of left lower extremity: Secondary | ICD-10-CM | POA: Diagnosis not present

## 2016-10-24 DIAGNOSIS — Z87891 Personal history of nicotine dependence: Secondary | ICD-10-CM | POA: Insufficient documentation

## 2016-10-24 DIAGNOSIS — I82419 Acute embolism and thrombosis of unspecified femoral vein: Secondary | ICD-10-CM | POA: Diagnosis not present

## 2016-10-24 DIAGNOSIS — J45909 Unspecified asthma, uncomplicated: Secondary | ICD-10-CM | POA: Insufficient documentation

## 2016-10-24 MED ORDER — RIVAROXABAN (XARELTO) VTE STARTER PACK (15 & 20 MG): ORAL_TABLET | ORAL | 0 refills | Status: DC

## 2016-10-24 NOTE — Discharge Instructions (Signed)
Please seek medical attention for any high fevers, chest pain, shortness of breath, change in behavior, persistent vomiting, bloody stool or any other new or concerning symptoms.  

## 2016-10-24 NOTE — ED Triage Notes (Signed)
Pt c/o pain and swelling to the left upper leg for the past 3 days with a hx of DVT with IVC filter.

## 2016-10-24 NOTE — ED Provider Notes (Signed)
[facility] Emergency Department Provider Note   ____________________________________________   I have reviewed the triage vital signs and the nursing notes.   HISTORY  Chief Complaint Leg Swelling   History limited by: Not Limited   HPI [name] [name] is a 51 y.o. female who presents to the emergency department today because of concerns for left leg swelling and pain. Patient states that these symptoms started a few days ago. They have continuously got worse and constant. They did remind the patient of her previous episodes of DVT. Patient is currently not on any anti-coagulants. She did at one time have an IVC filter however that has been removed. Patient states she does travel a significant amount for her work. She denies any chest pain or shortness of breath.   Past Medical History:  Diagnosis Date  . Allergy   . Asthma   . DVT (deep vein thrombosis) in pregnancy (HCC) 1998  . DVT (deep venous thrombosis) (HCC) 03/2014  . History of mammography, screening 04/2014  . History of shingles   . Overweight   . Prediabetes   . Snoring   . Thrombosis of pelvic vein   . Vitamin D deficiency     Patient Active Problem List   Diagnosis Date Noted  . Moderate intermittent asthma with acute exacerbation 07/02/2015  . Perennial allergic rhinitis with seasonal variation 07/02/2015  . Hypokalemia 07/02/2015  . History of DVT of lower extremity     Past Surgical History:  Procedure Laterality Date  . INSERTION OF VENA CAVA FILTER  04/2014   Dr. Wyn Quakerew   . TUBAL LIGATION      Prior to Admission medications   Medication Sig Start Date End Date Taking? Authorizing Provider  albuterol (PROVENTIL HFA;VENTOLIN HFA) 108 (90 BASE) MCG/ACT inhaler Inhale 2 puffs into the lungs every 6 (six) hours as needed for wheezing or shortness of breath. 07/02/15   Alba CoryKrichna Sowles, MD  amoxicillin-clavulanate (AUGMENTIN) 875-125 MG tablet Take 1 tablet by mouth 2 (two)  times daily. 07/23/15   Alba CoryKrichna Sowles, MD  chlorpheniramine-HYDROcodone (TUSSIONEX PENNKINETIC ER) 10-8 MG/5ML SUER Take 5 mLs by mouth every 12 (twelve) hours as needed for cough. 07/23/15   Alba CoryKrichna Sowles, MD  Fluticasone Furoate-Vilanterol (BREO ELLIPTA) 100-25 MCG/INH AEPB Inhale 1 puff into the lungs daily. 07/02/15   Alba CoryKrichna Sowles, MD  loratadine (CLARITIN) 10 MG tablet Take 1 tablet (10 mg total) by mouth daily. 07/02/15   Alba CoryKrichna Sowles, MD  montelukast (SINGULAIR) 10 MG tablet Take 1 tablet (10 mg total) by mouth at bedtime. 07/02/15   Alba CoryKrichna Sowles, MD  potassium chloride SA (K-DUR,KLOR-CON) 20 MEQ tablet Take 1 tablet (20 mEq total) by mouth daily. 07/02/15   Alba CoryKrichna Sowles, MD  Rivaroxaban 15 & 20 MG TBPK Take as directed on package: Start with one 15mg  tablet by mouth twice a day with food. On Day 22, switch to one 20mg  tablet once a day with food. 10/24/16   Phineas SemenGraydon Barack Nicodemus, MD    Allergies Patient has no known allergies.  Family History  Problem Relation Age of Onset  . Leukemia Son 3    Social History Social History  Substance Use Topics  . Smoking status: Former Smoker    Years: 10.00    Types: Cigarettes  . Smokeless tobacco: Never Used  . Alcohol use 0.0 oz/week    Review of Systems  Constitutional: Negative for fever. Cardiovascular: Negative for chest pain. Respiratory: Negative for shortness of breath. Gastrointestinal: Negative for abdominal pain,  vomiting and diarrhea. Musculoskeletal: Left leg pain. Skin: Negative for rash. Neurological: Negative for headaches, focal weakness or numbness.  10-point ROS otherwise negative.  ____________________________________________   PHYSICAL EXAM:  VITAL SIGNS: ED Triage Vitals [10/24/16 1358]  Enc Vitals Group     BP (!) 167/92     Pulse Rate 69     Resp 18     Temp 99.3 F (37.4 C)     Temp Source Oral     SpO2 100 %     Weight 168 lb (76.2 kg)     Height 5\' 5"  (1.651 m)     Head Circumference       Peak Flow      Pain Score 6   Constitutional: Alert and oriented. Well appearing and in no distress. Eyes: Conjunctivae are normal. Normal extraocular movements. ENT   Head: Normocephalic and atraumatic.   Nose: No congestion/rhinnorhea.   Mouth/Throat: Mucous membranes are moist.   Neck: No stridor. Hematological/Lymphatic/Immunilogical: No cervical lymphadenopathy. Cardiovascular: Normal rate, regular rhythm.  No murmurs, rubs, or gallops. Respiratory: Normal respiratory effort without tachypnea nor retractions. Breath sounds are clear and equal bilaterally. No wheezes/rales/rhonchi. Gastrointestinal: Soft and non tender. No rebound. No guarding.  Genitourinary: Deferred Musculoskeletal: Normal range of motion in all extremities. No lower extremity edema. Neurologic:  Normal speech and language. No gross focal neurologic deficits are appreciated.  Skin:  Skin is warm, dry and intact. No rash noted. Psychiatric: Mood and affect are normal. Speech and behavior are normal. Patient exhibits appropriate insight and judgment.  ____________________________________________    LABS (pertinent positives/negatives)  None  ____________________________________________   EKG  None  ____________________________________________    RADIOLOGY  [place] lower extremity IMPRESSION: The study is positive for deep venous thrombosis as detailed above. Preliminary results were given to Fayette Pho, RN, prior to formal interpretation.  ____________________________________________   PROCEDURES  Procedures  ____________________________________________   INITIAL IMPRESSION / ASSESSMENT AND PLAN / ED COURSE  Pertinent labs & imaging results that were available during my care of the patient were reviewed by me and considered in my medical decision making (see chart for details).  Patient presented to the emergency department today because of concerns for left leg pain.  Ultrasound does show DVT. Will place patient on Xarelto. Will have patient follow-up with Dr. Ledell Noss surgery.  ____________________________________________   FINAL CLINICAL IMPRESSION(S) / ED DIAGNOSES  Final diagnoses:  Acute deep vein thrombosis (DVT) of left lower extremity, unspecified vein (HCC)     Note: This dictation was prepared with Dragon dictation. Any transcriptional errors that result from this process are unintentional     Phineas Semen, MD 10/24/16 1538

## 2016-10-27 ENCOUNTER — Other Ambulatory Visit (INDEPENDENT_AMBULATORY_CARE_PROVIDER_SITE_OTHER): Payer: Self-pay | Admitting: Vascular Surgery

## 2016-10-27 ENCOUNTER — Encounter (INDEPENDENT_AMBULATORY_CARE_PROVIDER_SITE_OTHER): Payer: Self-pay | Admitting: Vascular Surgery

## 2016-10-27 ENCOUNTER — Ambulatory Visit (INDEPENDENT_AMBULATORY_CARE_PROVIDER_SITE_OTHER): Payer: 59 | Admitting: Vascular Surgery

## 2016-10-27 VITALS — BP 153/95 | HR 74 | Resp 17 | Wt 166.0 lb

## 2016-10-27 DIAGNOSIS — Z86718 Personal history of other venous thrombosis and embolism: Secondary | ICD-10-CM

## 2016-10-30 NOTE — Progress Notes (Signed)
Subjective:    Patient ID: [name] [name], female    DOB: Dec 12, 1965, 51 y.o.   MRN: [id number] Chief Complaint  Patient presents with  . Follow-up    Patient presents with a chief complaint of "DVT". Patient endorses a history of lower extremity DVT's. She has undergone venous lysis due to extensive DVT in the past. She states DVT's in 1998 and 2016. She has by seen by a hematologist and a work up was negative. She does state she "travels" a lot. She presents today after being seen in the ED and diagnosed with an extensive left lower extremity DVT. She was placed on Xarelto by ED staff. She is experienced left lower extremity swelling and pain. Her symptoms are interfering with her ability to functioning on a daily basis. She is unable to complete her daily activities. Ultrasound on 10/24/16 in the ED with DVT the length of the common femoral vein. Denies any fever, nausea or vomiting. Denies any SOB or chest pain.     Review of Systems  Constitutional: Negative.   HENT: Negative.   Eyes: Negative.   Respiratory: Negative.   Cardiovascular: Positive for leg swelling.       Left Lower Extremity Pain  Gastrointestinal: Negative.   Endocrine: Negative.   Genitourinary: Negative.   Skin: Negative.   Allergic/Immunologic: Negative.   Neurological: Negative.   Hematological: Negative.   Psychiatric/Behavioral: Negative.       Objective:   Physical Exam  Constitutional: She is oriented to person, place, and time. She appears well-developed and well-nourished. No distress.  HENT:  Head: Normocephalic and atraumatic.  Eyes: Conjunctivae are normal. Pupils are equal, round, and reactive to light.  Neck: Normal range of motion.  Cardiovascular: Normal rate, regular rhythm, normal heart sounds and intact distal pulses.   Pulses:      Radial pulses are 2+ on the right side, and 2+ on the left side.       Dorsalis pedis pulses are 2+ on the right side, and 2+ on the left side.      Posterior tibial pulses are 2+ on the right side, and 2+ on the left side.  Left Lower Extremity: Moderate 2+ edema noted. Pain with palpation. Pain with dorsiflexsion. No ulceration.   Pulmonary/Chest: Effort normal.  Musculoskeletal: Normal range of motion. She exhibits edema.  Neurological: She is alert and oriented to person, place, and time.  Skin: Skin is warm and dry. She is not diaphoretic.  Psychiatric: She has a normal mood and affect. Her behavior is normal. Judgment and thought content normal.   BP (!) 153/95   Pulse 74   Resp 17   Wt 166 lb (75.3 kg)   LMP 10/20/2016   BMI 27.62 kg/m   Past Medical History:  Diagnosis Date  . Allergy   . Asthma   . DVT (deep vein thrombosis) in pregnancy (HCC) 1998  . DVT (deep venous thrombosis) (HCC) 03/2014  . History of mammography, screening 04/2014  . History of shingles   . Overweight   . Prediabetes   . Snoring   . Thrombosis of pelvic vein   . Vitamin D deficiency    Social History   Social History  . Marital status: Single    Spouse name: N/A  . Number of children: N/A  . Years of education: N/A   Occupational History  . Not on file.   Social History Main Topics  . Smoking status: Former Smoker    Years:  10.00    Types: Cigarettes  . Smokeless tobacco: Never Used  . Alcohol use 0.0 oz/week  . Drug use: Unknown  . Sexual activity: Not on file   Other Topics Concern  . Not on file   Social History Narrative  . No narrative on file   Past Surgical History:  Procedure Laterality Date  . INSERTION OF VENA CAVA FILTER  04/2014   Dr. Wyn Quaker   . TUBAL LIGATION     Family History  Problem Relation Age of Onset  . Leukemia Son 3   No Known Allergies     Assessment & Plan:   1. History of DVT of lower extremity - New Extensive DVT on duplex performed at [facility] ED.  Symptomatic, unable to perform normal ADL's due to pain. PE with significant swelling and pan. Recommend LLE venous lysis to improve PE and  symptoms. Procedure, risks and benefits explained to patient. All questions answered. Patient wishes to proceed.  Current Outpatient Prescriptions on File Prior to Visit  Medication Sig Dispense Refill  . loratadine (CLARITIN) 10 MG tablet Take 1 tablet (10 mg total) by mouth daily. 30 tablet 5  . Rivaroxaban 15 & 20 MG TBPK Take as directed on package: Start with one  tablet by mouth twice a day with food. On Day 22, switch to one  tablet once a day with food. 51 each 0   No current facility-administered medications on file prior to visit.    There are no Patient Instructions on file for this visit. No Follow-up on file.  KIMBERLY A STEGMAYER, PA-C

## 2016-11-01 ENCOUNTER — Encounter
Admission: RE | Disposition: A | Discharge status: Home / Self Care | Payer: Self-pay | Source: Ambulatory Visit | Attending: Vascular Surgery

## 2016-11-01 ENCOUNTER — Ambulatory Visit
Admission: RE | Admit: 2016-11-01 | Discharge: 2016-11-01 | Disposition: A | Discharge status: Home / Self Care | Payer: 59 | Source: Ambulatory Visit | Attending: Vascular Surgery | Admitting: Vascular Surgery

## 2016-11-01 ENCOUNTER — Encounter: Payer: Self-pay | Admitting: *Deleted

## 2016-11-01 DIAGNOSIS — Z9851 Tubal ligation status: Secondary | ICD-10-CM | POA: Insufficient documentation

## 2016-11-01 DIAGNOSIS — I82402 Acute embolism and thrombosis of unspecified deep veins of left lower extremity: Secondary | ICD-10-CM

## 2016-11-01 DIAGNOSIS — E559 Vitamin D deficiency, unspecified: Secondary | ICD-10-CM | POA: Insufficient documentation

## 2016-11-01 DIAGNOSIS — R0683 Snoring: Secondary | ICD-10-CM | POA: Diagnosis not present

## 2016-11-01 DIAGNOSIS — Z806 Family history of leukemia: Secondary | ICD-10-CM | POA: Insufficient documentation

## 2016-11-01 DIAGNOSIS — Z8619 Personal history of other infectious and parasitic diseases: Secondary | ICD-10-CM | POA: Insufficient documentation

## 2016-11-01 DIAGNOSIS — Z87891 Personal history of nicotine dependence: Secondary | ICD-10-CM | POA: Insufficient documentation

## 2016-11-01 DIAGNOSIS — R7303 Prediabetes: Secondary | ICD-10-CM | POA: Diagnosis not present

## 2016-11-01 DIAGNOSIS — E663 Overweight: Secondary | ICD-10-CM | POA: Insufficient documentation

## 2016-11-01 DIAGNOSIS — Z6827 Body mass index (BMI) 27.0-27.9, adult: Secondary | ICD-10-CM | POA: Diagnosis not present

## 2016-11-01 HISTORY — PX: PERIPHERAL VASCULAR THROMBECTOMY: CATH118306

## 2016-11-01 LAB — CREATININE, SERUM
CREATININE: 0.6 mg/dL (ref 0.44–1.00)
GFR calc Af Amer: >60 mL/min (ref >60)
GFR calc non Af Amer: >60 mL/min (ref >60)

## 2016-11-01 LAB — BUN: BUN: 14 mg/dL (ref 6–20)

## 2016-11-01 SURGERY — PERIPHERAL VASCULAR THROMBECTOMY
Anesthesia: Moderate Sedation

## 2016-11-01 MED ORDER — OXYCODONE-ACETAMINOPHEN 5-325 MG PO TABS
1.0000 | ORAL_TABLET | ORAL | Status: DC | PRN
  Administered 2016-11-01: 2 via ORAL

## 2016-11-01 MED ORDER — SODIUM CHLORIDE 0.9 % IV SOLN
500.0000 mL | Freq: Once | INTRAVENOUS | Status: DC | PRN
Start: 2016-11-01 — End: 2016-11-01

## 2016-11-01 MED ORDER — FENTANYL CITRATE (PF) 100 MCG/2ML IJ SOLN
INTRAMUSCULAR | Status: AC
  Filled 2016-11-01: qty 2

## 2016-11-01 MED ORDER — METOPROLOL TARTRATE 5 MG/5ML IV SOLN: 2.0000 mg | INTRAVENOUS | Status: DC | PRN

## 2016-11-01 MED ORDER — ACETAMINOPHEN 325 MG PO TABS: 325.0000 mg | ORAL_TABLET | ORAL | Status: DC | PRN

## 2016-11-01 MED ORDER — PHENOL 1.4 % MT LIQD
1.0000 | OROMUCOSAL | Status: DC | PRN
  Filled 2016-11-01: qty 177

## 2016-11-01 MED ORDER — IOPAMIDOL (ISOVUE-300) INJECTION 61%
INTRAVENOUS | Status: DC | PRN
Start: 2016-11-01 — End: 2016-11-01
  Administered 2016-11-01: 60 mL via INTRAVENOUS

## 2016-11-01 MED ORDER — SODIUM CHLORIDE 0.9 % IV SOLN
INTRAVENOUS | Status: DC
  Administered 2016-11-01: 13:00:00 via INTRAVENOUS

## 2016-11-01 MED ORDER — HYDROMORPHONE HCL 1 MG/ML IJ SOLN
1.0000 mg | Freq: Once | INTRAMUSCULAR | Status: AC | PRN
  Administered 2016-11-01: 0.5 mg via INTRAVENOUS

## 2016-11-01 MED ORDER — ALTEPLASE 2 MG IJ SOLR
INTRAMUSCULAR | Status: AC
  Filled 2016-11-01: qty 10

## 2016-11-01 MED ORDER — MIDAZOLAM HCL 2 MG/2ML IJ SOLN
INTRAMUSCULAR | Status: DC | PRN
  Administered 2016-11-01 (×3): 2 mg via INTRAVENOUS

## 2016-11-01 MED ORDER — HYDRALAZINE HCL 20 MG/ML IJ SOLN: 5.0000 mg | INTRAMUSCULAR | Status: DC | PRN

## 2016-11-01 MED ORDER — HEPARIN SODIUM (PORCINE) 1000 UNIT/ML IJ SOLN
INTRAMUSCULAR | Status: AC
  Filled 2016-11-01: qty 1

## 2016-11-01 MED ORDER — LIDOCAINE-EPINEPHRINE (PF) 2 %-1:200000 IJ SOLN
INTRAMUSCULAR | Status: AC
  Filled 2016-11-01: qty 20

## 2016-11-01 MED ORDER — MIDAZOLAM HCL 5 MG/5ML IJ SOLN
INTRAMUSCULAR | Status: AC
  Filled 2016-11-01: qty 5

## 2016-11-01 MED ORDER — HYDROMORPHONE HCL 1 MG/ML IJ SOLN
INTRAMUSCULAR | Status: AC
  Administered 2016-11-01: 0.5 mg via INTRAVENOUS
  Filled 2016-11-01: qty 0.5

## 2016-11-01 MED ORDER — MORPHINE SULFATE (PF) 4 MG/ML IV SOLN: 2.0000 mg | INTRAVENOUS | Status: DC | PRN

## 2016-11-01 MED ORDER — ONDANSETRON HCL 4 MG/2ML IJ SOLN: 4.0000 mg | Freq: Four times a day (QID) | INTRAMUSCULAR | Status: DC | PRN

## 2016-11-01 MED ORDER — FENTANYL CITRATE (PF) 100 MCG/2ML IJ SOLN
INTRAMUSCULAR | Status: DC | PRN
  Administered 2016-11-01 (×3): 50 ug via INTRAVENOUS

## 2016-11-01 MED ORDER — GUAIFENESIN-DM 100-10 MG/5ML PO SYRP
15.0000 mL | ORAL_SOLUTION | ORAL | Status: DC | PRN
  Filled 2016-11-01: qty 15

## 2016-11-01 MED ORDER — MIDAZOLAM HCL 2 MG/2ML IJ SOLN
INTRAMUSCULAR | Status: AC
  Filled 2016-11-01: qty 2

## 2016-11-01 MED ORDER — HEPARIN (PORCINE) IN NACL 2-0.9 UNIT/ML-% IJ SOLN
INTRAMUSCULAR | Status: AC
  Filled 2016-11-01: qty 1000

## 2016-11-01 MED ORDER — CEFAZOLIN IN D5W 1 GM/50ML IV SOLN
1.0000 g | Freq: Once | INTRAVENOUS | Status: AC
  Administered 2016-11-01: 1 g via INTRAVENOUS

## 2016-11-01 MED ORDER — LABETALOL HCL 5 MG/ML IV SOLN
10.0000 mg | INTRAVENOUS | Status: DC | PRN
Start: 2016-11-01 — End: 2016-11-01

## 2016-11-01 MED ORDER — FAMOTIDINE 20 MG PO TABS: 40.0000 mg | ORAL_TABLET | ORAL | Status: DC | PRN

## 2016-11-01 MED ORDER — HEPARIN SODIUM (PORCINE) 1000 UNIT/ML IJ SOLN
INTRAMUSCULAR | Status: DC | PRN
  Administered 2016-11-01: 3000 [IU] via INTRAVENOUS

## 2016-11-01 MED ORDER — ALTEPLASE 2 MG IJ SOLR
INTRAMUSCULAR | Status: DC | PRN
  Administered 2016-11-01: 10 mg

## 2016-11-01 MED ORDER — ALUM & MAG HYDROXIDE-SIMETH 200-200-20 MG/5ML PO SUSP: 15.0000 mL | ORAL | Status: DC | PRN

## 2016-11-01 MED ORDER — OXYCODONE-ACETAMINOPHEN 5-325 MG PO TABS
ORAL_TABLET | ORAL | Status: AC
  Administered 2016-11-01: 2 via ORAL
  Filled 2016-11-01: qty 2

## 2016-11-01 MED ORDER — METHYLPREDNISOLONE SODIUM SUCC 125 MG IJ SOLR: 125.0000 mg | INTRAMUSCULAR | Status: DC | PRN

## 2016-11-01 MED ORDER — ACETAMINOPHEN 325 MG RE SUPP
325.0000 mg | RECTAL | Status: DC | PRN
  Filled 2016-11-01: qty 2

## 2016-11-01 SURGICAL SUPPLY — 17 items
BALLN ARMADA 10X80X80 (BALLOONS) ×3
BALLN ARMADA 12X80X80 (BALLOONS) ×3
BALLOON ARMADA 10X80X80 (BALLOONS) ×2 IMPLANT
BALLOON ARMADA 12X80X80 (BALLOONS) ×2 IMPLANT
CANISTER PENUMBRA MAX (MISCELLANEOUS) ×3 IMPLANT
CANNULA 5F STIFF (CANNULA) ×3 IMPLANT
CATH INDIGO D 50CM (CATHETERS) ×3 IMPLANT
CATH VERT 100CM (CATHETERS) ×3 IMPLANT
DEVICE PRESTO INFLATION (MISCELLANEOUS) ×3 IMPLANT
DRAPE BRACHIAL (DRAPES) ×3 IMPLANT
DRAPE TABLE BACK 80X90 (DRAPES) ×3 IMPLANT
FILTER VC CELECT-FEMORAL (Filter) ×3 IMPLANT
GLIDEWIRE ADV .035X260CM (WIRE) ×3 IMPLANT
PACK ANGIOGRAPHY (CUSTOM PROCEDURE TRAY) ×3 IMPLANT
STENT LIFESTAR 14X80X80 (Permanent Stent) ×3 IMPLANT
WIRE J 3MM .035X145CM (WIRE) ×3 IMPLANT
WIRE NITINOL .018 (WIRE) ×3 IMPLANT

## 2016-11-01 NOTE — Progress Notes (Signed)
Patient stable post procedure, vitals stable. Pain improved after prn  Dilaudid given earlier. No bleeding nor hematoma at groin site. Family member at bedside. Discharge teaching done earlier per [facility] R[facility] with return appointment.

## 2016-11-01 NOTE — Op Note (Signed)
[facility]   OPERATIVE NOTE   PRE-OPERATIVE DIAGNOSIS: extensive LLE DVT  POST-OPERATIVE DIAGNOSIS: same   PROCEDURE: 1. US guidance for vascular access to left popliteal vein 2. Catheter placement into IVC from the left popliteal vein approach  3. IVC gram and left lower extremity venogram 4. IVC filter placement 5.   Catheter directed thrombolysis with 10 mg of TPA to the left femoral and iliac veins 6. Mechanical thrombectomy to the left SFV, common femoral vein, and iliac veins 7. PTA of left external and common iliac veins with 10 mm balloon 8. 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  SPECIMEN(S): none  INDICATIONS:  Patient is a 51 y.o. female who presents with recurrent extensive left lower extremity DVT. Patient has marked leg swelling and pain. Venous intervention is performed to reduce the symtpoms and avoid long term postphlebitic symptoms.   DESCRIPTION: After obtaining full informed written consent, the patient was brought back to the vascular suite and placed supine upon the table.Moderate conscious sedation was administered during a face to face encounter with the patient throughout the procedure with my supervision of the RN administering medicines and monitoring the patient's vital signs, pulse oximetry, telemetry and mental status throughout from the start of the procedure until the patient was taken to the recovery room. After  obtaining adequate anesthesia, the patient was prepped and draped in the standard fashion. The right femoral vein was then interrogated with ultrasound and not found to be patent or accessible for filter placement. The patient was then placed in the prone position. The left popliteal vein was accessed under US guidance and found to be widely patent. It was accessed without difficulty and a permanent image was recorded. I then placed the delivery sheath into the IVC. The IVC was patent and the renal veins were at the level of L1. The Cook Celect IVC filter was then deployed at the level of the top of L2.The delivery sheath was removed over an advantage wire and I then upsized to an 8Fr sheath over the advantage wire. 3000 units of heparin were then given. 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 A Kumpe catheter and Advantage wire were then advanced into the CFV and images were performed. This better delineated the May Thurner syndrome with the severe iliac vein stenosis I was able to cross the thrombus and stenosis and advance into the IVC which was patent. I then used the Kumpe catheter and instilled 10 mg of tpa throughout the SFV, CFV, and iliac veins.  After this dwelled for 15 minutes, I used the penumbra CAT D catheter and evacuated about 400 of effluent with mechanical thrombectomy throughout the iliac veins, CFV, and SFV. This had mild to moderate improvement, but the iliac vein stenosis remained the primary problem. I then turned  my attention to the iliac veins. The stenosis/occlusion and thrombus was treated with a 10 mm diameter angioplasty balloon inflated twice.  There was still reasonably high-grade residual stenosis in the 75-80% range over a reasonably long segment of the iliac veins elected to  place a stent there. A 14 mm diameter by 8 cm length self-expanding stent was deployed in the left external and common iliac veins. This was postdilated with a 12 mm balloon with an excellent angiographic result and less than 10% residual stenosis. Another pass with the penumbra CAT D catheter helped evacuate the small amount of residual thrombus in the superficial femoral vein and the common femoral vein. Overall, I would estimate that 80% or more of the initial thrombus was removed with the procedure today. I then elected to terminate the procedure. The sheath was removed and a dressing was placed. She was taken to the recovery room in stable condition having tolerated the procedure well.   COMPLICATIONS: None  CONDITION: Stable  Festus Barren 11/01/2016 3:43 PM

## 2016-11-01 NOTE — H&P (Signed)
[facility] History & Physical Update  The patient was interviewed and re-examined.  The patient's previous History and Physical has been reviewed and is unchanged.  There is no change in the plan of care. We plan to proceed with the scheduled procedure.  Festus Barren, MD  11/01/2016, 1:53 PM

## 2016-11-04 ENCOUNTER — Encounter: Payer: Self-pay | Admitting: Vascular Surgery

## 2016-11-14 ENCOUNTER — Telehealth (INDEPENDENT_AMBULATORY_CARE_PROVIDER_SITE_OTHER): Payer: Self-pay

## 2016-11-14 NOTE — Telephone Encounter (Signed)
Patient called stating that she was having pain in the right leg and was asking for more pain meds.I spoke with KS and she advise for the patient to take tylenol or ibuprofen for the pain; also to elevate and wear compression stockings.At this time the patient should keep the same appointment.

## 2016-11-24 ENCOUNTER — Telehealth (INDEPENDENT_AMBULATORY_CARE_PROVIDER_SITE_OTHER): Payer: Self-pay | Admitting: Vascular Surgery

## 2016-11-24 NOTE — Telephone Encounter (Signed)
NEEDS XARELTO  RX

## 2016-11-28 ENCOUNTER — Other Ambulatory Visit (INDEPENDENT_AMBULATORY_CARE_PROVIDER_SITE_OTHER): Payer: Self-pay

## 2016-12-08 ENCOUNTER — Other Ambulatory Visit (INDEPENDENT_AMBULATORY_CARE_PROVIDER_SITE_OTHER): Payer: Self-pay | Admitting: Vascular Surgery

## 2016-12-08 DIAGNOSIS — I82402 Acute embolism and thrombosis of unspecified deep veins of left lower extremity: Secondary | ICD-10-CM

## 2016-12-12 ENCOUNTER — Encounter (INDEPENDENT_AMBULATORY_CARE_PROVIDER_SITE_OTHER): Payer: Self-pay | Admitting: Vascular Surgery

## 2016-12-12 ENCOUNTER — Ambulatory Visit (INDEPENDENT_AMBULATORY_CARE_PROVIDER_SITE_OTHER): Payer: 59 | Admitting: Vascular Surgery

## 2016-12-12 ENCOUNTER — Ambulatory Visit (INDEPENDENT_AMBULATORY_CARE_PROVIDER_SITE_OTHER): Payer: 59

## 2016-12-12 VITALS — BP 115/68 | HR 68 | Resp 17 | Ht 65.0 in | Wt 175.0 lb

## 2016-12-12 DIAGNOSIS — I82402 Acute embolism and thrombosis of unspecified deep veins of left lower extremity: Secondary | ICD-10-CM

## 2016-12-12 DIAGNOSIS — Z86718 Personal history of other venous thrombosis and embolism: Secondary | ICD-10-CM

## 2016-12-12 NOTE — Progress Notes (Signed)
Subjective:    Patient ID: [name] [name], female    DOB: Dec 31, 1965, 51 y.o.   MRN: [id number] Chief Complaint  Patient presents with  . DVT    follow up   Patient presents for a post-procedure follow up. 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 Patient continues to take Xarelto. Her symptoms have improved. Denies any fever, nausea or vomiting. Denies any chest pain or SOB. Left Lower Extremity Venous Duplex which improvement when compared to previous. 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 Wearing compression and elevating her legs.    Review of Systems  Constitutional: Negative.   HENT: Negative.   Eyes: Negative.   Respiratory: Negative.   Cardiovascular:       Left Lower Extremity Recurrent DVT  Gastrointestinal: Negative.   Endocrine: Negative.   Genitourinary: Negative.   Musculoskeletal: Negative.   Skin: Negative.   Allergic/Immunologic: Negative.   Neurological: Negative.   Hematological: Negative.   Psychiatric/Behavioral: Negative.       Objective:   Physical Exam  Constitutional: She is oriented to person, place, and time. She appears well-developed and well-nourished. No distress.  HENT:  Head: Normocephalic and atraumatic.  Eyes: Conjunctivae are normal. Pupils are equal, round, and reactive to light.  Neck: Normal range of motion.  Cardiovascular: Normal rate, regular rhythm, normal heart sounds and intact distal pulses.   Pulses:      Radial pulses are 2+ on the right side, and 2+ on the left side.       Dorsalis pedis pulses are 2+ on the right side, and 2+ on the left side.       Posterior tibial pulses are 2+ on the right side, and 2+ on the left side.  Pulmonary/Chest: Effort normal.  Musculoskeletal: Normal range of motion. She exhibits no edema.  Left Lower Extremity: No edema. No  pain with palpation.   Neurological: She is alert and oriented to person, place, and time.  Skin: Skin is warm and dry. She is not diaphoretic.  Psychiatric: She has a normal mood and affect. Her behavior is normal. Judgment and thought content normal.  Vitals reviewed.  BP 115/68   Pulse 68   Resp 17   Ht 5\' 5"  (1.651 m)   Wt 175 lb (79.4 kg)   BMI 29.12 kg/m   Past Medical History:  Diagnosis Date  . Allergy   . Asthma   . DVT (deep vein thrombosis) in pregnancy (HCC) 1998  . DVT (deep venous thrombosis) (HCC) 03/2014  . History of mammography, screening 04/2014  . History of shingles   . Overweight   . Prediabetes   . Snoring   . Thrombosis of pelvic vein   . Vitamin D deficiency    Social History   Social History  . Marital status: Single    Spouse name: N/A  . Number of children: N/A  . Years of education: N/A   Occupational History  . Not on file.   Social History Main Topics  . Smoking status: Former Smoker    Years: 10.00    Types: Cigarettes  . Smokeless tobacco: Never Used  . Alcohol use 0.0 oz/week  . Drug use: Unknown  . Sexual activity: Not on file   Other Topics Concern  . Not on file   Social History Narrative  . No  narrative on file    Past Surgical History:  Procedure Laterality Date  . INSERTION OF VENA CAVA FILTER  04/2014   Dr. Wyn Quakerew   . PERIPHERAL VASCULAR THROMBECTOMY Left 11/01/2016   Procedure: Peripheral Vascular Thrombectomy;  Surgeon: Annice NeedyJason S Dew, MD;  Location: [facility] INVASIVE CV LAB;  Service: Cardiovascular;  Laterality: Left;  . TUBAL LIGATION     Family History  Problem Relation Age of Onset  . Leukemia Son 3   No Known Allergies     Assessment & Plan:  Patient presents for a post-procedure follow up. 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 Patient continues to take Xarelto. Her  symptoms have improved. Denies any fever, nausea or vomiting. Denies any chest pain or SOB. Left Lower Extremity Venous Duplex which improvement when compared to previous. 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 Wearing compression and elevating her legs.   1. History of DVT of lower extremity - Improvement Improvement in symptoms, physical exam and ultrasound s/p endovascular procedure on 11/01/16 Will plan on IVC filter removal - Will schedule. Procedure, risks and benefits explained to patient. All questions answered. Patient wishes to proceed.  Patient to continue Xarelto for at least six months due to recurrent and extensive DVT. Patient now with stent. Unable to take ASA - will place on Dr. Driscilla Grammesew's schedule at 6 month mark to discuss future anticoagulation due to recurrent DVT / Stent / Inability to tale ASA Patient is in agreement  - VAS US LOWER EXTREMITY VENOUS (DVT); Future  Current Outpatient Prescriptions on File Prior to Visit  Medication Sig Dispense Refill  . acetaminophen (TYLENOL) 500 MG tablet Take 1,000 mg by mouth 2 (two) times daily as needed for moderate pain.    Marland Kitchen. loratadine (CLARITIN) 10 MG tablet Take 1 tablet (10 mg total) by mouth daily. 30 tablet 5  . Multiple Vitamin (MULTIVITAMIN WITH MINERALS) TABS tablet Take 1 tablet by mouth daily.    Marland Kitchen. OVER THE COUNTER MEDICATION Take 1 capsule by mouth daily. Pine Pollen    . Rivaroxaban 15 & 20 MG TBPK Take as directed on package: Start with one 15mg  tablet by mouth twice a day with food. On Day 22, switch to one 20mg  tablet once a day with food. 51 each 0   No current facility-administered medications on file prior to visit.     There are no Patient Instructions on file for this visit. No Follow-up on file.   Luiza Carranco A Shraga Custard, PA-C

## 2016-12-27 ENCOUNTER — Other Ambulatory Visit (INDEPENDENT_AMBULATORY_CARE_PROVIDER_SITE_OTHER): Payer: Self-pay | Admitting: Vascular Surgery

## 2016-12-28 MED ORDER — CEFAZOLIN SODIUM-DEXTROSE 2-4 GM/100ML-% IV SOLN
2.0000 g | Freq: Once | INTRAVENOUS | Status: AC
  Administered 2016-12-29: 2 g via INTRAVENOUS

## 2016-12-29 ENCOUNTER — Ambulatory Visit
Admission: RE | Admit: 2016-12-29 | Discharge: 2016-12-29 | Disposition: A | Discharge status: Home / Self Care | Payer: 59 | Source: Ambulatory Visit | Attending: Vascular Surgery | Admitting: Vascular Surgery

## 2016-12-29 ENCOUNTER — Encounter
Admission: RE | Disposition: A | Discharge status: Home / Self Care | Payer: Self-pay | Source: Ambulatory Visit | Attending: Vascular Surgery

## 2016-12-29 DIAGNOSIS — Z86718 Personal history of other venous thrombosis and embolism: Secondary | ICD-10-CM | POA: Insufficient documentation

## 2016-12-29 DIAGNOSIS — Z7902 Long term (current) use of antithrombotics/antiplatelets: Secondary | ICD-10-CM | POA: Diagnosis not present

## 2016-12-29 DIAGNOSIS — Z9889 Other specified postprocedural states: Secondary | ICD-10-CM | POA: Diagnosis not present

## 2016-12-29 DIAGNOSIS — Z8619 Personal history of other infectious and parasitic diseases: Secondary | ICD-10-CM | POA: Insufficient documentation

## 2016-12-29 DIAGNOSIS — Z452 Encounter for adjustment and management of vascular access device: Secondary | ICD-10-CM | POA: Diagnosis not present

## 2016-12-29 DIAGNOSIS — I8290 Acute embolism and thrombosis of unspecified vein: Secondary | ICD-10-CM

## 2016-12-29 DIAGNOSIS — R0683 Snoring: Secondary | ICD-10-CM | POA: Insufficient documentation

## 2016-12-29 DIAGNOSIS — Z9851 Tubal ligation status: Secondary | ICD-10-CM | POA: Diagnosis not present

## 2016-12-29 DIAGNOSIS — Z87891 Personal history of nicotine dependence: Secondary | ICD-10-CM | POA: Insufficient documentation

## 2016-12-29 DIAGNOSIS — E663 Overweight: Secondary | ICD-10-CM | POA: Diagnosis not present

## 2016-12-29 DIAGNOSIS — Z6829 Body mass index (BMI) 29.0-29.9, adult: Secondary | ICD-10-CM | POA: Insufficient documentation

## 2016-12-29 DIAGNOSIS — Z806 Family history of leukemia: Secondary | ICD-10-CM | POA: Diagnosis not present

## 2016-12-29 DIAGNOSIS — E559 Vitamin D deficiency, unspecified: Secondary | ICD-10-CM | POA: Insufficient documentation

## 2016-12-29 DIAGNOSIS — R7303 Prediabetes: Secondary | ICD-10-CM | POA: Diagnosis not present

## 2016-12-29 HISTORY — PX: IVC FILTER REMOVAL: CATH118246

## 2016-12-29 SURGERY — IVC FILTER REMOVAL
Anesthesia: Moderate Sedation

## 2016-12-29 MED ORDER — SODIUM CHLORIDE 0.9 % IV SOLN
INTRAVENOUS | Status: DC
  Administered 2016-12-29: 09:00:00 via INTRAVENOUS

## 2016-12-29 MED ORDER — CEFAZOLIN SODIUM-DEXTROSE 2-4 GM/100ML-% IV SOLN
INTRAVENOUS | Status: AC
  Filled 2016-12-29: qty 100

## 2016-12-29 MED ORDER — IOPAMIDOL (ISOVUE-300) INJECTION 61%
INTRAVENOUS | Status: DC | PRN
  Administered 2016-12-29: 15 mL via INTRAVENOUS

## 2016-12-29 MED ORDER — MIDAZOLAM HCL 5 MG/5ML IJ SOLN
INTRAMUSCULAR | Status: AC
  Filled 2016-12-29: qty 5

## 2016-12-29 MED ORDER — ALUM & MAG HYDROXIDE-SIMETH 200-200-20 MG/5ML PO SUSP: 15.0000 mL | ORAL | Status: DC | PRN

## 2016-12-29 MED ORDER — PHENOL 1.4 % MT LIQD: 1.0000 | OROMUCOSAL | Status: DC | PRN

## 2016-12-29 MED ORDER — ONDANSETRON HCL 4 MG/2ML IJ SOLN
4.0000 mg | Freq: Four times a day (QID) | INTRAMUSCULAR | Status: DC | PRN
Start: 2016-12-29 — End: 2016-12-29

## 2016-12-29 MED ORDER — OXYCODONE-ACETAMINOPHEN 5-325 MG PO TABS: 1.0000 | ORAL_TABLET | ORAL | Status: DC | PRN

## 2016-12-29 MED ORDER — HYDRALAZINE HCL 20 MG/ML IJ SOLN: 5.0000 mg | INTRAMUSCULAR | Status: DC | PRN

## 2016-12-29 MED ORDER — FENTANYL CITRATE (PF) 100 MCG/2ML IJ SOLN
50.0000 ug | INTRAMUSCULAR | Status: DC | PRN
Start: 2016-12-29 — End: 2016-12-29
  Administered 2016-12-29: 50 ug via INTRAVENOUS

## 2016-12-29 MED ORDER — ACETAMINOPHEN 325 MG PO TABS: 325.0000 mg | ORAL_TABLET | ORAL | Status: DC | PRN

## 2016-12-29 MED ORDER — FENTANYL CITRATE (PF) 100 MCG/2ML IJ SOLN
INTRAMUSCULAR | Status: AC
  Filled 2016-12-29: qty 2

## 2016-12-29 MED ORDER — MIDAZOLAM HCL 2 MG/2ML IJ SOLN
INTRAMUSCULAR | Status: DC | PRN
  Administered 2016-12-29: 2 mg via INTRAVENOUS

## 2016-12-29 MED ORDER — GUAIFENESIN-DM 100-10 MG/5ML PO SYRP: 15.0000 mL | ORAL_SOLUTION | ORAL | Status: DC | PRN

## 2016-12-29 MED ORDER — LABETALOL HCL 5 MG/ML IV SOLN: 10.0000 mg | INTRAVENOUS | Status: DC | PRN

## 2016-12-29 MED ORDER — ONDANSETRON HCL 4 MG/2ML IJ SOLN: 4.0000 mg | Freq: Four times a day (QID) | INTRAMUSCULAR | Status: DC | PRN

## 2016-12-29 MED ORDER — HYDROMORPHONE HCL 1 MG/ML IJ SOLN: 1.0000 mg | Freq: Once | INTRAMUSCULAR | Status: DC | PRN

## 2016-12-29 MED ORDER — LIDOCAINE-EPINEPHRINE (PF) 2 %-1:200000 IJ SOLN
INTRAMUSCULAR | Status: AC
  Filled 2016-12-29: qty 20

## 2016-12-29 MED ORDER — METOPROLOL TARTRATE 5 MG/5ML IV SOLN: 2.0000 mg | INTRAVENOUS | Status: DC | PRN

## 2016-12-29 MED ORDER — ACETAMINOPHEN 325 MG RE SUPP: 325.0000 mg | RECTAL | Status: DC | PRN

## 2016-12-29 MED ORDER — MORPHINE SULFATE (PF) 4 MG/ML IV SOLN: 2.0000 mg | INTRAVENOUS | Status: DC | PRN

## 2016-12-29 MED ORDER — SODIUM CHLORIDE 0.9 % IV SOLN: 500.0000 mL | Freq: Once | INTRAVENOUS | Status: DC | PRN

## 2016-12-29 SURGICAL SUPPLY — 3 items
PACK ANGIOGRAPHY (CUSTOM PROCEDURE TRAY) ×2 IMPLANT
SET VENACAVA FILTER RETRIEVAL (MISCELLANEOUS) ×2 IMPLANT
TOWEL OR 17X26 4PK STRL BLUE (TOWEL DISPOSABLE) ×2 IMPLANT

## 2016-12-29 NOTE — H&P (Signed)
[facility] History & Physical Update  The patient was interviewed and re-examined.  The patient's previous History and Physical has been reviewed and is unchanged.  There is no change in the plan of care. We plan to proceed with the scheduled procedure.  Festus BarrenJason Azuri Bozard, MD  12/29/2016, 8:53 AM

## 2016-12-29 NOTE — Op Note (Signed)
[facility]   OPERATIVE NOTE    PRE-OPERATIVE DIAGNOSIS:  1. DVT 2. status post IVC filter placement  POST-OPERATIVE DIAGNOSIS: Same as above  PROCEDURE: 1. Ultrasound guidance for vascular access right jugular vein 2. Catheter placement into inferior vena cava from right jugular vein 3. Inferior venacavogram 4. Retrieval of Cook Celect IVC filter  SURGEON: Festus BarrenJason Jaidon Sponsel, MD  ASSISTANT(S): None  ANESTHESIA: Local with moderate conscious sedation for approximately 15 minutes using 2 mg of Versed and 50 mcg of Fentanyl  ESTIMATED BLOOD LOSS: minimal  CONTRAST:  15 cc  FLUORO TIME:  0.7 minutes  FINDING(S): 1. patent IVC  SPECIMEN(S): IVC filter  INDICATIONS:  Patient is a 51 y.o. female who presents with a previous history of IVC filter placement. Patient has tolerated anticoagulation and is well past her thrombectomy procedure and no longer needs this filter. The patient remains on anticoagulation. Risks and benefits were discussed, and informed consent was obtained.  DESCRIPTION: After obtaining full informed written consent, the patient was brought back to the vascular suite and placed supine upon the table.Moderate conscious sedation was administered during a face to face encounter with the patient throughout the procedure with my supervision of the RN administering medicines and monitoring the patient's vital signs, pulse oximetry, telemetry and mental status throughout from the start of the procedure until the patient was taken to the recovery room.  After obtaining adequate anesthesia, the patient was prepped and draped in the standard fashion. The right jugular vein was visualized with ultrasound and found to be widely patent. It was then accessed under direct ultrasound guidance without difficulty with the Seldinger needle and a permanent image was recorded. A J-wire was placed. After skin nick and dilatation, the retrieval sheath was  placed over the wire and advanced into the inferior vena cava. Inferior vena cava was imaged and found to be widely patent on inferior venacavogram. The filter was straight in its orientation. The retrieval snare was then placed through the sheath and the hook of the filter was snared without difficulty. The sheath was then advanced, and the filter was collapsed and brought into the sheath in its entirety. It was then removed from the body in its entirety. The retrieval sheath was then removed. Pressure was held at the access site and sterile dressing was placed. The patient was taken to the recovery room in stable condition having tolerated the procedure well.  COMPLICATIONS: None  CONDITION: Stable   Festus BarrenJason Vishal Sandlin 12/29/2016 10:40 AM  This note was created with Dragon Medical transcription system. Any errors in dictation are purely unintentional.

## 2017-02-07 ENCOUNTER — Telehealth: Payer: Self-pay | Admitting: Family Medicine

## 2017-02-07 DIAGNOSIS — IMO0001 Reserved for inherently not codable concepts without codable children: Secondary | ICD-10-CM

## 2017-02-07 DIAGNOSIS — J4521 Mild intermittent asthma with (acute) exacerbation: Secondary | ICD-10-CM

## 2017-02-07 NOTE — Telephone Encounter (Signed)
Patient requesting refill of Proventil and Breo inhaler to [facility].

## 2017-02-09 NOTE — Telephone Encounter (Signed)
Pt scheduled an appt for Monday.

## 2017-02-09 NOTE — Telephone Encounter (Signed)
lvm for pt to schedule an appointment. Also informed her that we could get her in today.

## 2017-02-13 ENCOUNTER — Ambulatory Visit: Payer: 59 | Admitting: Family Medicine

## 2017-02-22 ENCOUNTER — Emergency Department: Payer: 59

## 2017-02-22 ENCOUNTER — Encounter: Payer: Self-pay | Admitting: Physician Assistant

## 2017-02-22 ENCOUNTER — Emergency Department
Admission: EM | Admit: 2017-02-22 | Discharge: 2017-02-22 | Disposition: A | Discharge status: Home / Self Care | Payer: 59 | Attending: Emergency Medicine | Admitting: Emergency Medicine

## 2017-02-22 DIAGNOSIS — M25511 Pain in right shoulder: Secondary | ICD-10-CM

## 2017-02-22 DIAGNOSIS — S161XXA Strain of muscle, fascia and tendon at neck level, initial encounter: Secondary | ICD-10-CM

## 2017-02-22 DIAGNOSIS — J45909 Unspecified asthma, uncomplicated: Secondary | ICD-10-CM | POA: Diagnosis not present

## 2017-02-22 DIAGNOSIS — Y9389 Activity, other specified: Secondary | ICD-10-CM | POA: Insufficient documentation

## 2017-02-22 DIAGNOSIS — Z79899 Other long term (current) drug therapy: Secondary | ICD-10-CM | POA: Insufficient documentation

## 2017-02-22 DIAGNOSIS — Z87891 Personal history of nicotine dependence: Secondary | ICD-10-CM | POA: Diagnosis not present

## 2017-02-22 DIAGNOSIS — Y92410 Unspecified street and highway as the place of occurrence of the external cause: Secondary | ICD-10-CM | POA: Diagnosis not present

## 2017-02-22 DIAGNOSIS — Y999 Unspecified external cause status: Secondary | ICD-10-CM | POA: Insufficient documentation

## 2017-02-22 DIAGNOSIS — M542 Cervicalgia: Secondary | ICD-10-CM | POA: Diagnosis not present

## 2017-02-22 DIAGNOSIS — S199XXA Unspecified injury of neck, initial encounter: Secondary | ICD-10-CM | POA: Diagnosis not present

## 2017-02-22 MED ORDER — CYCLOBENZAPRINE HCL 5 MG PO TABS: 5.0000 mg | ORAL_TABLET | Freq: Three times a day (TID) | ORAL | 0 refills | Status: DC | PRN

## 2017-02-22 MED ORDER — CYCLOBENZAPRINE HCL 10 MG PO TABS
5.0000 mg | ORAL_TABLET | Freq: Once | ORAL | Status: AC
  Administered 2017-02-22: 5 mg via ORAL
  Filled 2017-02-22: qty 1

## 2017-02-22 MED ORDER — ACETAMINOPHEN 325 MG PO TABS
650.0000 mg | ORAL_TABLET | Freq: Once | ORAL | Status: AC
  Administered 2017-02-22: 650 mg via ORAL
  Filled 2017-02-22: qty 2

## 2017-02-22 MED ORDER — HYDROCODONE-ACETAMINOPHEN 5-325 MG PO TABS: 1.0000 | ORAL_TABLET | Freq: Four times a day (QID) | ORAL | 0 refills | Status: DC | PRN
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
## 2017-04-07 ENCOUNTER — Ambulatory Visit (INDEPENDENT_AMBULATORY_CARE_PROVIDER_SITE_OTHER): Payer: 59 | Admitting: Vascular Surgery

## 2017-04-07 ENCOUNTER — Encounter (INDEPENDENT_AMBULATORY_CARE_PROVIDER_SITE_OTHER): Payer: 59

## 2017-05-12 DIAGNOSIS — M7501 Adhesive capsulitis of right shoulder: Secondary | ICD-10-CM | POA: Insufficient documentation

## 2017-06-27 ENCOUNTER — Telehealth (INDEPENDENT_AMBULATORY_CARE_PROVIDER_SITE_OTHER): Payer: Self-pay | Admitting: Vascular Surgery

## 2017-06-27 ENCOUNTER — Other Ambulatory Visit (INDEPENDENT_AMBULATORY_CARE_PROVIDER_SITE_OTHER): Payer: Self-pay

## 2017-06-27 MED ORDER — XARELTO 20 MG PO TABS: 20.0000 mg | ORAL_TABLET | Freq: Every day | ORAL | 2 refills | Status: DC

## 2017-06-27 MED ORDER — XARELTO 20 MG PO TABS: 20.0000 mg | ORAL_TABLET | Freq: Every day | ORAL | 5 refills | Status: DC

## 2017-06-27 NOTE — Telephone Encounter (Signed)
Refill sent electronically to [facility] mail order today

## 2017-06-27 NOTE — Telephone Encounter (Signed)
New Message   *STAT* If patient is at the pharmacy, call can be transferred to refill team.   1. Which medications need to be refilled? (please list name of each medication and dose if known) xarelto 20 mg tablets daily at supper  2. Which pharmacy/location (including street and city if local pharmacy) is medication to be sent to? [facility]  3. Do they need a 30 day or 90 day supply?  90 day supply

## 2017-09-11 ENCOUNTER — Other Ambulatory Visit: Payer: Self-pay | Admitting: Family Medicine

## 2017-09-11 ENCOUNTER — Ambulatory Visit
Admission: RE | Admit: 2017-09-11 | Discharge: 2017-09-11 | Disposition: A | Discharge status: Home / Self Care | Payer: 59 | Source: Ambulatory Visit | Attending: Family Medicine | Admitting: Family Medicine

## 2017-09-11 ENCOUNTER — Ambulatory Visit: Payer: 59 | Admitting: Family Medicine

## 2017-09-11 ENCOUNTER — Other Ambulatory Visit
Admission: RE | Admit: 2017-09-11 | Discharge: 2017-09-11 | Disposition: A | Discharge status: Home / Self Care | Payer: 59 | Source: Ambulatory Visit | Attending: Family Medicine | Admitting: Family Medicine

## 2017-09-11 ENCOUNTER — Encounter: Payer: Self-pay | Admitting: Family Medicine

## 2017-09-11 VITALS — BP 126/74 | HR 89 | Temp 98.6°F | Resp 16 | Ht 65.0 in | Wt 167.8 lb

## 2017-09-11 DIAGNOSIS — Z7901 Long term (current) use of anticoagulants: Secondary | ICD-10-CM

## 2017-09-11 DIAGNOSIS — E876 Hypokalemia: Secondary | ICD-10-CM

## 2017-09-11 DIAGNOSIS — R079 Chest pain, unspecified: Secondary | ICD-10-CM | POA: Insufficient documentation

## 2017-09-11 DIAGNOSIS — J4520 Mild intermittent asthma, uncomplicated: Secondary | ICD-10-CM | POA: Insufficient documentation

## 2017-09-11 DIAGNOSIS — IMO0001 Reserved for inherently not codable concepts without codable children: Secondary | ICD-10-CM

## 2017-09-11 DIAGNOSIS — I10 Essential (primary) hypertension: Secondary | ICD-10-CM

## 2017-09-11 DIAGNOSIS — Z86718 Personal history of other venous thrombosis and embolism: Secondary | ICD-10-CM

## 2017-09-11 LAB — COMPREHENSIVE METABOLIC PANEL
ALBUMIN: 4.1 g/dL (ref 3.5–5.0)
ALT: 9 U/L — ABNORMAL LOW (ref 14–54)
ANION GAP: 9 (ref 5–15)
AST: 19 U/L (ref 15–41)
Alkaline Phosphatase: 53 U/L (ref 38–126)
BUN: 16 mg/dL (ref 6–20)
CO2: 30 mmol/L (ref 22–32)
Calcium: 9.2 mg/dL (ref 8.9–10.3)
Chloride: 95 mmol/L — ABNORMAL LOW (ref 101–111)
Creatinine, Ser: 0.88 mg/dL (ref 0.44–1.00)
GFR calc Af Amer: >60 mL/min (ref >60)
GFR calc non Af Amer: >60 mL/min (ref >60)
GLUCOSE: 87 mg/dL (ref 65–99)
POTASSIUM: 3 mmol/L — AB (ref 3.5–5.1)
SODIUM: 134 mmol/L — AB (ref 135–145)
Total Bilirubin: 0.5 mg/dL (ref 0.3–1.2)
Total Protein: 7.4 g/dL (ref 6.5–8.1)

## 2017-09-11 LAB — CBC WITH DIFFERENTIAL/PLATELET
Basophils Absolute: 0.1 10*3/uL (ref 0–0.1)
Basophils Relative: 3 %
Eosinophils Absolute: 0.1 10*3/uL (ref 0–0.7)
Eosinophils Relative: 3 %
HEMATOCRIT: 35.9 % (ref 35.0–47.0)
Hemoglobin: 11.5 g/dL — ABNORMAL LOW (ref 12.0–16.0)
LYMPHS PCT: 38 %
Lymphs Abs: 1.7 10*3/uL (ref 1.0–3.6)
MCH: 26.9 pg (ref 26.0–34.0)
MCHC: 32.1 g/dL (ref 32.0–36.0)
MCV: 83.9 fL (ref 80.0–100.0)
MONO ABS: 0.4 10*3/uL (ref 0.2–0.9)
MONOS PCT: 9 %
NEUTROS ABS: 2.2 10*3/uL (ref 1.4–6.5)
Neutrophils Relative %: 47 %
Platelets: 237 10*3/uL (ref 150–440)
RBC: 4.28 MIL/uL (ref 3.80–5.20)
RDW: 15.2 % — AB (ref 11.5–14.5)
WBC: 4.5 10*3/uL (ref 3.6–11.0)

## 2017-09-11 LAB — TROPONIN I: Troponin I: <0.03 ng/mL (ref <0.03)

## 2017-09-11 LAB — FIBRIN DERIVATIVES D-DIMER (ARMC ONLY): Fibrin derivatives D-dimer (ARMC): 124.62 ng/mL (FEU) (ref 0.00–499.00)

## 2017-09-11 MED ORDER — HYDROCHLOROTHIAZIDE 25 MG PO TABS: 25.0000 mg | ORAL_TABLET | Freq: Every day | ORAL | 3 refills | Status: DC

## 2017-09-11 MED ORDER — HYDROCHLOROTHIAZIDE 25 MG PO TABS: 25.0000 mg | ORAL_TABLET | Freq: Every day | ORAL | 0 refills | Status: DC

## 2017-09-11 MED ORDER — POTASSIUM CHLORIDE ER 20 MEQ PO TBCR: 20.0000 meq | EXTENDED_RELEASE_TABLET | Freq: Every day | ORAL | 0 refills | Status: DC

## 2017-09-11 MED ORDER — LOSARTAN POTASSIUM-HCTZ 50-12.5 MG PO TABS: 1.0000 | ORAL_TABLET | Freq: Every day | ORAL | 0 refills | Status: DC

## 2017-09-11 NOTE — Patient Instructions (Addendum)
Go [facility] and have labs and chest Xray performed.  Please keep your cell phone on so that we can call with results.

## 2017-09-11 NOTE — Progress Notes (Signed)
Name: [name] [name]   MRN: [id number]    DOB: [birth year]   Date:09/11/2017       Progress Note  Subjective  Chief Complaint  Chief Complaint  Patient presents with  . Hypertension    [facility], BP was 183/113, 160/97 for 1 week  . Chest Pain    radaites to left arm and shoulder    HPI  Pt presents for Urgent Care Follow Up  HTN: 09/04/2017 she was checking BP at home and noticed BP was elevated - remained elevated the next few days - 160-190's/100-110's.  [facility] [place] on 09/07/17.  Was told she had RIGHT ear infection.  Was given HCTZ 25mg  once daily, augmentin BID, and albuterol inhaler.  She used to take BP medication 4 years ago - went off after she lost weight and got her numbers under control.  She does endorse shortness of breath, but she has been dealing with a respiratory infection.  Endorses some palpitations, intermittent posterior headache.  Denies vision changes, extremity weakness/numbness/tingling, confusion, slurred speech.  Chest Discomfort: She also notes that she has been experiencing chest discomfort since 09/06/2017. LEFT sided chest pain that radiates into the LEFT shoulder and arm - no ECG or chest Xray or labs were performed.  Pain is a dull ache that is constant, worse when she lays down.  She does endorse shortness of breath, but she has been dealing with a respiratory infection.  Also endorses some palpitations - says "I feel like my heart is beating hard" - has used albuterol inhaler maybe twice in the last week.  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 Wyn Quakerew; taking Xarelto 20mg  daily with good compliance.  No history of PE, though she does endorse some shortness of breath as above.  She denies any LE tenderness/pain/swelling/redness.  Patient Active Problem List   Diagnosis Date Noted  . Moderate intermittent asthma with acute exacerbation 07/02/2015  . Perennial allergic rhinitis with seasonal variation  07/02/2015  . Hypokalemia 07/02/2015  . History of DVT of lower extremity     Social History   Tobacco Use  . Smoking status: Former Smoker    Years: 10.00    Types: Cigarettes    Last attempt to quit: 08/01/2008    Years since quitting: 9.1  . Smokeless tobacco: Never Used  Substance Use Topics  . Alcohol use: Yes    Alcohol/week: 0.0 oz     Current Outpatient Medications:  .  acetaminophen (TYLENOL) 500 MG tablet, Take 1,000 mg by mouth 2 (two) times daily as needed for moderate pain., Disp: , Rfl:  .  loratadine (CLARITIN) 10 MG tablet, Take 1 tablet (10 mg total) by mouth daily., Disp: 30 tablet, Rfl: 5 .  Multiple Vitamin (MULTIVITAMIN WITH MINERALS) TABS tablet, Take 1 tablet by mouth daily., Disp: , Rfl:  .  XARELTO 20 MG TABS tablet, Take 1 tablet (20 mg total) by mouth daily with supper., Disp: 90 tablet, Rfl: 2 .  amoxicillin-clavulanate (AUGMENTIN) 875-125 MG tablet, , Disp: , Rfl:  .  cyclobenzaprine (FLEXERIL) 5 MG tablet, Take 1 tablet (5 mg total) by mouth 3 (three) times daily as needed for muscle spasms. (Patient not taking: Reported on 09/11/2017), Disp: 15 tablet, Rfl: 0 .  HYDROcodone-acetaminophen (NORCO) 5-325 MG tablet, Take 1 tablet by mouth every 6 (six) hours as needed. (Patient not taking: Reported on 09/11/2017), Disp: 12 tablet, Rfl: 0  No Known  Allergies  ROS  Ten systems reviewed and is negative except as mentioned in HPI  Objective  Vitals:   09/11/17 1103 09/11/17 1108  BP: (!) 142/76 126/74  Pulse: 89   Resp: 16   Temp: 98.6 F (37 C)   TempSrc: Oral   SpO2: 98%   Weight: 167 lb 12.8 oz (76.1 kg)   Height: 5\' 5"  (1.651 m)    Body mass index is 27.92 kg/m.  Nursing Note and Vital Signs reviewed.  Physical Exam  Constitutional: Patient appears well-developed and well-nourished.  No distress in office, generally well-appearing, pleasant, speaks in full complete sentences without difficulty.  HEENT: head atraumatic, normocephalic,  neck supple without lymphadenopathy, oropharynx pink and moist without exudate Cardiovascular: Normal rate, regular rhythm, S1/S2 present.  No murmur or rub heard. No BLE edema. Pulmonary/Chest: Effort normal and breath sounds clear. No respiratory distress or retractions. Psychiatric: Patient has a normal mood and affect. behavior is normal. Judgment and thought content normal. Musculoskeletal: Normal range of motion, no joint effusions. No gross deformities.  LEFT shoulder is non-tender, left chest is non-tender with no reproducible MSK pain. LE is grossly normal - no swelling, tenderness, or erythema. Neurological: she is alert and oriented to person, place, and time. No cranial nerve deficit. Coordination, balance, strength, speech and gait are normal.  Skin: Skin is warm and dry. No rash noted. No erythema.  Psychiatric: Patient has a normal mood and affect. behavior is normal. Judgment and thought content normal.  No results found for this or any previous visit (from the past 72 hour(s)).  Assessment & Plan  1. Chest pain, unspecified type - EKG 12-Lead - EKG: normal sinus rhythm, no ST changes.  Reviewed with attending MD Dr. Carlynn Purl. - D-Dimer, Quantitative - CBC w/Diff/Platelet - Comprehensive metabolic panel - Troponin I - DG Chest 2 View; Future  2. Hypertension, unspecified type - EKG 12-Lead - hydrochlorothiazide (HYDRODIURIL) 25 MG tablet; Take 1 tablet (25 mg total) by mouth daily.  Dispense: 90 tablet; Refill: 0 - Comprehensive metabolic panel  3. Moderate intermittent asthma without complication - DG Chest 2 View; Future  4. History of DVT of lower extremity - D-Dimer, Quantitative - CBC w/Diff/Platelet - Comprehensive metabolic panel - DG Chest 2 View; Future  5. Anticoagulant long-term use - D-Dimer, Quantitative - CBC w/Diff/Platelet  - We discussed option to present for emergency care vs stat labs and chest xray, pt chooses not to present for emergency care,  and as her vitals are stable, she is well-appearing, and her symptoms have been ongoing and stable for a week, I am comfortable with this plan at this time.  She is advised to go directly to the medical mall for testing; cell phone number verified in demographics.  -Red flags and when to present for emergency care or RTC including fever >101.36F, worsening chest pain or shortness of breath, new/worsening/un-resolving symptoms, calf/LE pain/tenderness/swelling/redness reviewed with patient at time of visit. Follow up and care instructions discussed and provided in AVS.

## 2017-09-18 ENCOUNTER — Ambulatory Visit: Payer: 59 | Admitting: Family Medicine

## 2017-09-18 ENCOUNTER — Encounter: Payer: Self-pay | Admitting: Family Medicine

## 2017-09-18 VITALS — BP 150/100 | HR 80 | Temp 99.1°F | Resp 14 | Ht 65.0 in | Wt 170.5 lb

## 2017-09-18 DIAGNOSIS — E876 Hypokalemia: Secondary | ICD-10-CM

## 2017-09-18 DIAGNOSIS — Z566 Other physical and mental strain related to work: Secondary | ICD-10-CM

## 2017-09-18 DIAGNOSIS — Z23 Encounter for immunization: Secondary | ICD-10-CM | POA: Diagnosis not present

## 2017-09-18 DIAGNOSIS — IMO0001 Reserved for inherently not codable concepts without codable children: Secondary | ICD-10-CM

## 2017-09-18 DIAGNOSIS — I10 Essential (primary) hypertension: Secondary | ICD-10-CM

## 2017-09-18 DIAGNOSIS — J4521 Mild intermittent asthma with (acute) exacerbation: Secondary | ICD-10-CM | POA: Diagnosis not present

## 2017-09-18 MED ORDER — ALBUTEROL SULFATE HFA 108 (90 BASE) MCG/ACT IN AERS: 2.0000 | INHALATION_SPRAY | Freq: Four times a day (QID) | RESPIRATORY_TRACT | 0 refills | Status: DC | PRN

## 2017-09-18 MED ORDER — FLUTICASONE FUROATE-VILANTEROL 100-25 MCG/INH IN AEPB: 1.0000 | INHALATION_SPRAY | Freq: Every day | RESPIRATORY_TRACT | 2 refills | Status: DC

## 2017-09-18 MED ORDER — AMLODIPINE BESYLATE 2.5 MG PO TABS: 2.5000 mg | ORAL_TABLET | Freq: Every day | ORAL | 0 refills | Status: DC

## 2017-09-18 MED ORDER — PREDNISONE 10 MG (21) PO TBPK: ORAL_TABLET | ORAL | 0 refills | Status: DC

## 2017-09-18 NOTE — Progress Notes (Signed)
Name: [name] [name]   MRN: [id number]    DOB: 1965/08/05   Date:09/18/2017       Progress Note  Subjective  Chief Complaint  Chief Complaint  Patient presents with  . Hypertension  . URI    HPI  Asthma: she was seen last week to see Raelyn Ensign, NP. She is only using rescue inhaler more often than usual, coughing daily, she denies wheezing, but has chest tightness and some SOB.   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 Still getting adjusted to the change. She is now on losartan HCTZ but bp is still elevated, 150's/80's at home, bp was even higher before started on medication last week. History of DVT , d-dimer negative.   OM: treated at Urgent care with antibiotic, no ear pain or cold symptoms   Low potassium: we will recheck labs.  Patient Active Problem List   Diagnosis Date Noted  . Hypertension 09/11/2017  . Adhesive capsulitis of right shoulder 05/12/2017  . Moderate intermittent asthma with acute exacerbation 07/02/2015  . Perennial allergic rhinitis with seasonal variation 07/02/2015  . Hypokalemia 07/02/2015  . History of DVT of lower extremity     Past Surgical History:  Procedure Laterality Date  . INSERTION OF VENA CAVA FILTER  04/2014   Dr. Lucky Cowboy   . IVC FILTER REMOVAL N/A 12/29/2016   Procedure: IVC Filter Removal;  Surgeon: Algernon Huxley, MD;  Location: [facility];  Service: Cardiovascular;  Laterality: N/A;  . PERIPHERAL VASCULAR THROMBECTOMY Left 11/01/2016   Procedure: Peripheral Vascular Thrombectomy;  Surgeon: Algernon Huxley, MD;  Location: [facility];  Service: Cardiovascular;  Laterality: Left;  . TUBAL LIGATION      Family History  Problem Relation Age of Onset  . Leukemia Son 3    Social History   Socioeconomic History  . Marital status: Single    Spouse name: Not on file  . Number of children: Not on file  .  Years of education: Not on file  . Highest education level: Not on file  Social Needs  . Financial resource strain: Not on file  . Food insecurity - worry: Not on file  . Food insecurity - inability: Not on file  . Transportation needs - medical: Not on file  . Transportation needs - non-medical: Not on file  Occupational History  . Not on file  Tobacco Use  . Smoking status: Former Smoker    Years: 10.00    Types: Cigarettes    Last attempt to quit: 08/01/2008    Years since quitting: 9.1  . Smokeless tobacco: Never Used  Substance and Sexual Activity  . Alcohol use: Yes    Alcohol/week: 0.0 oz  . Drug use: Not on file  . Sexual activity: Not on file  Other Topics Concern  . Not on file  Social History Narrative  . Not on file     Current Outpatient Medications:  .  acetaminophen (TYLENOL) 500 MG tablet, Take 1,000 mg by mouth 2 (two) times daily as needed for moderate pain., Disp: , Rfl:  .  albuterol (PROAIR HFA) 108 (90 Base) MCG/ACT inhaler, Inhale 2 puffs into the lungs every 6 (six) hours as needed for wheezing or shortness of breath., Disp: 1 Inhaler, Rfl: 0 .  amLODipine (NORVASC) 2.5 MG tablet, Take 1 tablet (2.5 mg total) by  mouth daily., Disp: 30 tablet, Rfl: 0 .  fluticasone furoate-vilanterol (BREO ELLIPTA) 100-25 MCG/INH AEPB, Inhale 1 puff into the lungs daily., Disp: 60 each, Rfl: 2 .  loratadine (CLARITIN) 10 MG tablet, Take 1 tablet (10 mg total) by mouth daily., Disp: 30 tablet, Rfl: 5 .  losartan-hydrochlorothiazide (HYZAAR) 50-12.5 MG tablet, Take 1 tablet by mouth daily., Disp: 30 tablet, Rfl: 0 .  Multiple Vitamin (MULTIVITAMIN WITH MINERALS) TABS tablet, Take 1 tablet by mouth daily., Disp: , Rfl:  .  predniSONE (STERAPRED UNI-PAK 21 TAB) 10 MG (21) TBPK tablet, Take as directed, Disp: 21 tablet, Rfl: 0 .  XARELTO 20 MG TABS tablet, Take 1 tablet (20 mg total) by mouth daily with supper., Disp: 90 tablet, Rfl: 2  No Known  Allergies   ROS  Constitutional: Negative for fever or weight change.  Respiratory: Positive  for cough and shortness of breath.   Cardiovascular: Negative for chest pain ( she has tightness from asthma)  or palpitations.  Gastrointestinal: Negative for abdominal pain, no bowel changes.  Musculoskeletal: Negative for gait problem or joint swelling.  Skin: Negative for rash.  Neurological: Negative for dizziness or headache.  No other specific complaints in a complete review of systems (except as listed in HPI above).  Objective  Vitals:   09/18/17 1128  BP: (!) 150/100  Pulse: 80  Resp: 14  Temp: 99.1 F (37.3 C)  TempSrc: Oral  SpO2: 98%  Weight: 170 lb 8 oz (77.3 kg)  Height: _0  (1.651 m)    Body mass index is 28.37 kg/m.  Physical Exam  Constitutional: Patient appears well-developed and well-nourished. No distress.  HEENT: head atraumatic, normocephalic, pupils equal and reactive to light, ears : normal TM bilateral,  neck supple, throat within normal limits Cardiovascular: Normal rate, regular rhythm and normal heart sounds.  No murmur heard. No BLE edema. Pulmonary/Chest: Effort normal and breath sounds normal. No respiratory distress. Abdominal: Soft.  There is no tenderness. Psychiatric: Patient has a normal mood and affect. behavior is normal. Judgment and thought content normal.  Recent Results (from the past 2160 hour(s))  Troponin I     Status: None   Collection Time: 09/11/17  1:11 PM  Result Value Ref Range   Troponin I <0.03 <0.03 ng/mL    Comment: Performed at [facility], [address] [place],  [address]  Comprehensive metabolic panel     Status: Abnormal   Collection Time: 09/11/17  1:11 PM  Result Value Ref Range   Sodium 134 (L) 135 - 145 mmol/L   Potassium 3.0 (L) 3.5 - 5.1 mmol/L   Chloride 95 (L) 101 - 111 mmol/L   CO2 30 22 - 32 mmol/L   Glucose, Bld 87 65 - 99 mg/dL   BUN 16 6 - 20 mg/dL   Creatinine, Ser 0.88  0.44 - 1.00 mg/dL   Calcium 9.2 8.9 - 10.3 mg/dL   Total Protein 7.4 6.5 - 8.1 g/dL   Albumin 4.1 3.5 - 5.0 g/dL   AST 19 15 - 41 U/L   ALT 9 (L) 14 - 54 U/L   Alkaline Phosphatase 53 38 - 126 U/L   Total Bilirubin 0.5 0.3 - 1.2 mg/dL   GFR calc non Af Amer >60 >60 mL/min   GFR calc Af Amer >60 >60 mL/min    Comment: (NOTE) The eGFR has been calculated using the CKD EPI equation. This calculation has not been validated in all clinical situations. eGFR's persistently <60  mL/min signify possible Chronic Kidney Disease.    Anion gap 9 5 - 15    Comment: Performed at [facility], [address] [place], [place] [address]  CBC with Differential/Platelet     Status: Abnormal   Collection Time: 09/11/17  1:11 PM  Result Value Ref Range   WBC 4.5 3.6 - 11.0 K/uL   RBC 4.28 3.80 - 5.20 MIL/uL   Hemoglobin 11.5 (L) 12.0 - 16.0 g/dL   HCT 35.9 35.0 - 47.0 %   MCV 83.9 80.0 - 100.0 fL   MCH 26.9 26.0 - 34.0 pg   MCHC 32.1 32.0 - 36.0 g/dL   RDW 15.2 (H) 11.5 - 14.5 %   Platelets 237 150 - 440 K/uL   Neutrophils Relative % 47 %   Neutro Abs 2.2 1.4 - 6.5 K/uL   Lymphocytes Relative 38 %   Lymphs Abs 1.7 1.0 - 3.6 K/uL   Monocytes Relative 9 %   Monocytes Absolute 0.4 0.2 - 0.9 K/uL   Eosinophils Relative 3 %   Eosinophils Absolute 0.1 0 - 0.7 K/uL   Basophils Relative 3 %   Basophils Absolute 0.1 0 - 0.1 K/uL    Comment: Performed at [facility], [address] [place], [place] [address]  Fibrin derivatives D-Dimer [facility] only)     Status: None   Collection Time: 09/11/17  1:11 PM  Result Value Ref Range   Fibrin derivatives D-dimer ([facility]) 124.62 0.00 - 499.00 ng/mL (FEU)    Comment: (NOTE) <> Exclusion of Venous Thromboembolism (VTE) - OUTPATIENT ONLY   (Emergency Department or [place])   0-499 ng/ml (FEU): With a low to intermediate pretest probability                      for VTE this test result excludes the diagnosis                      of VTE.   >499  ng/ml (FEU) : VTE not excluded; additional work up for VTE is                      required. <> Testing on Inpatients and Evaluation of Disseminated Intravascular   Coagulation (DIC) Reference Range:   0-499 ng/ml (FEU) Performed at [facility], [address] [place], [place] [address]       PHQ2/9: Depression screen [facility] 2/9 09/11/2017 07/02/2015  Decreased Interest 0 0  Down, Depressed, Hopeless 0 0  PHQ - 2 Score 0 0     Fall Risk: Fall Risk  09/18/2017 09/11/2017 07/02/2015  Falls in the past year? No No No     Functional Status Survey: Is the patient deaf or have difficulty hearing?: No Does the patient have difficulty seeing, even when wearing glasses/contacts?: No Does the patient have difficulty concentrating, remembering, or making decisions?: No Does the patient have difficulty walking or climbing stairs?: No Does the patient have difficulty dressing or bathing?: No Does the patient have difficulty doing errands alone such as visiting a doctor's office or shopping?: No    Assessment & Plan  1. Moderate intermittent asthma with acute exacerbation  - predniSONE (STERAPRED UNI-PAK 21 TAB) 10 MG (21) TBPK tablet; Take as directed  Dispense: 21 tablet; Refill: 0 - fluticasone furoate-vilanterol (BREO ELLIPTA) 100-25 MCG/INH AEPB; Inhale 1 puff into the lungs daily.  Dispense: 60 each; Refill: 2 - albuterol (PROAIR HFA) 108 (90 Base)  MCG/ACT inhaler; Inhale 2 puffs into the lungs every 6 (six) hours as needed for wheezing or shortness of breath.  Dispense: 1 Inhaler; Refill: 0  2. Uncontrolled hypertension  - amLODipine (NORVASC) 2.5 MG tablet; Take 1 tablet (2.5 mg total) by mouth daily.  Dispense: 30 tablet; Refill: 0  3. Hypokalemia  - Potassium  4. Needs flu shot  refused  5. Stress at work  He has a new position , English as a second language teacher

## 2017-09-19 LAB — POTASSIUM: POTASSIUM: 3.3 mmol/L — AB (ref 3.5–5.2)

## 2017-10-08 ENCOUNTER — Other Ambulatory Visit: Payer: Self-pay | Admitting: Family Medicine

## 2017-10-08 DIAGNOSIS — I10 Essential (primary) hypertension: Secondary | ICD-10-CM

## 2017-10-15 ENCOUNTER — Other Ambulatory Visit: Payer: Self-pay | Admitting: Family Medicine

## 2017-10-15 DIAGNOSIS — I10 Essential (primary) hypertension: Secondary | ICD-10-CM

## 2017-10-16 NOTE — Telephone Encounter (Signed)
Refill request for general medication. [facility].   Last office visit: 09/18/2017  Follow up on 11/01/2017

## 2017-10-31 ENCOUNTER — Other Ambulatory Visit: Payer: Self-pay | Admitting: Family Medicine

## 2017-10-31 DIAGNOSIS — I10 Essential (primary) hypertension: Secondary | ICD-10-CM

## 2017-10-31 MED ORDER — AMLODIPINE BESYLATE 2.5 MG PO TABS: 2.5000 mg | ORAL_TABLET | Freq: Every day | ORAL | 0 refills | Status: DC

## 2017-10-31 MED ORDER — LOSARTAN POTASSIUM-HCTZ 50-12.5 MG PO TABS: 1.0000 | ORAL_TABLET | Freq: Every day | ORAL | 0 refills | Status: DC

## 2017-10-31 NOTE — Telephone Encounter (Signed)
Copied from CRM [phone]. Topic: Quick Communication - Rx Refill/Question >> Oct 31, 2017 12:08 PM [name] [name], [name] B wrote: Medication: amLODipine (NORVASC) 2.5 MG tablet [[id number]][[id number]] , losartan-hydrochlorothiazide (HYZAAR) 50-12.5 MG tablet [[id number]][[id number]] ,  Has the patient contacted their pharmacy? Yes.   (Agent: If no, request that the patient contact the pharmacy for the refill.) Preferred Pharmacy (with phone number [facility] street [facility]): [facility] Agent: Please be advised that RX refills may take up to 3 business days. We ask that you follow-up with your pharmacy.

## 2017-10-31 NOTE — Telephone Encounter (Signed)
Hypertension medication request: Amlodipine and Losartan-HCTZ  Last office visit pertaining to hypertension: 09/18/2017   BP Readings from Last 3 Encounters:  09/18/17 (!) 150/100  09/11/17 126/74  02/22/17 (!) 158/97    Lab Results  Component Value Date   CREATININE 0.88 09/11/2017   BUN 16 09/11/2017   NA 134 (L) 09/11/2017   K 3.3 (L) 09/18/2017   CL 95 (L) 09/11/2017   CO2 30 09/11/2017    Follow up on 02/19/2018

## 2017-11-01 ENCOUNTER — Encounter: Payer: 59 | Admitting: Family Medicine

## 2017-11-06 ENCOUNTER — Ambulatory Visit: Payer: 59

## 2017-12-04 ENCOUNTER — Other Ambulatory Visit: Payer: Self-pay | Admitting: Family Medicine

## 2018-02-19 ENCOUNTER — Ambulatory Visit (INDEPENDENT_AMBULATORY_CARE_PROVIDER_SITE_OTHER): Payer: 59 | Admitting: Family Medicine

## 2018-02-19 ENCOUNTER — Encounter

## 2018-02-19 ENCOUNTER — Encounter: Payer: Self-pay | Admitting: Family Medicine

## 2018-02-19 VITALS — BP 144/98 | HR 82 | Temp 98.7°F | Resp 16 | Ht 64.38 in | Wt 170.8 lb

## 2018-02-19 DIAGNOSIS — Z1239 Encounter for other screening for malignant neoplasm of breast: Secondary | ICD-10-CM

## 2018-02-19 DIAGNOSIS — I10 Essential (primary) hypertension: Secondary | ICD-10-CM

## 2018-02-19 DIAGNOSIS — Z124 Encounter for screening for malignant neoplasm of cervix: Secondary | ICD-10-CM

## 2018-02-19 DIAGNOSIS — Z01411 Encounter for gynecological examination (general) (routine) with abnormal findings: Secondary | ICD-10-CM | POA: Diagnosis not present

## 2018-02-19 DIAGNOSIS — Z01419 Encounter for gynecological examination (general) (routine) without abnormal findings: Secondary | ICD-10-CM

## 2018-02-19 DIAGNOSIS — Z1211 Encounter for screening for malignant neoplasm of colon: Secondary | ICD-10-CM

## 2018-02-19 DIAGNOSIS — Z7901 Long term (current) use of anticoagulants: Secondary | ICD-10-CM

## 2018-02-19 DIAGNOSIS — J4521 Mild intermittent asthma with (acute) exacerbation: Secondary | ICD-10-CM

## 2018-02-19 DIAGNOSIS — I82502 Chronic embolism and thrombosis of unspecified deep veins of left lower extremity: Secondary | ICD-10-CM

## 2018-02-19 DIAGNOSIS — Z23 Encounter for immunization: Secondary | ICD-10-CM

## 2018-02-19 DIAGNOSIS — IMO0001 Reserved for inherently not codable concepts without codable children: Secondary | ICD-10-CM

## 2018-02-19 MED ORDER — AMLODIPINE BESYLATE 2.5 MG PO TABS: 2.5000 mg | ORAL_TABLET | Freq: Every day | ORAL | 1 refills | Status: DC

## 2018-02-19 MED ORDER — LOSARTAN POTASSIUM-HCTZ 50-12.5 MG PO TABS: 1.0000 | ORAL_TABLET | Freq: Every day | ORAL | 1 refills | Status: DC

## 2018-02-19 NOTE — Patient Instructions (Signed)
Preventive Care 40-64 Years, Female Preventive care refers to lifestyle choices and visits with your health care provider that can promote health and wellness. What does preventive care include?  A yearly physical exam. This is also called an annual well check.  Dental exams once or twice a year.  Routine eye exams. Ask your health care provider how often you should have your eyes checked.  Personal lifestyle choices, including: ? Daily care of your teeth and gums. ? Regular physical activity. ? Eating a healthy diet. ? Avoiding tobacco and drug use. ? Limiting alcohol use. ? Practicing safe sex. ? Taking low-dose aspirin daily starting at age 58. ? Taking vitamin and mineral supplements as recommended by your health care provider. What happens during an annual well check? The services and screenings done by your health care provider during your annual well check will depend on your age, overall health, lifestyle risk factors, and family history of disease. Counseling Your health care provider may ask you questions about your:  Alcohol use.  Tobacco use.  Drug use.  Emotional well-being.  Home and relationship well-being.  Sexual activity.  Eating habits.  Work and work Statistician.  Method of birth control.  Menstrual cycle.  Pregnancy history.  Screening You may have the following tests or measurements:  Height, weight, and BMI.  Blood pressure.  Lipid and cholesterol levels. These may be checked every 5 years, or more frequently if you are over 81 years old.  Skin check.  Lung cancer screening. You may have this screening every year starting at age 78 if you have a 30-pack-year history of smoking and currently smoke or have quit within the past 15 years.  Fecal occult blood test (FOBT) of the stool. You may have this test every year starting at age 65.  Flexible sigmoidoscopy or colonoscopy. You may have a sigmoidoscopy every 5 years or a colonoscopy  every 10 years starting at age 30.  Hepatitis C blood test.  Hepatitis B blood test.  Sexually transmitted disease (STD) testing.  Diabetes screening. This is done by checking your blood sugar (glucose) after you have not eaten for a while (fasting). You may have this done every 1-3 years.  Mammogram. This may be done every 1-2 years. Talk to your health care provider about when you should start having regular mammograms. This may depend on whether you have a family history of breast cancer.  BRCA-related cancer screening. This may be done if you have a family history of breast, ovarian, tubal, or peritoneal cancers.  Pelvic exam and Pap test. This may be done every 3 years starting at age 80. Starting at age 36, this may be done every 5 years if you have a Pap test in combination with an HPV test.  Bone density scan. This is done to screen for osteoporosis. You may have this scan if you are at high risk for osteoporosis.  Discuss your test results, treatment options, and if necessary, the need for more tests with your health care provider. Vaccines Your health care provider may recommend certain vaccines, such as:  Influenza vaccine. This is recommended every year.  Tetanus, diphtheria, and acellular pertussis (Tdap, Td) vaccine. You may need a Td booster every 10 years.  Varicella vaccine. You may need this if you have not been vaccinated.  Zoster vaccine. You may need this after age 5.  Measles, mumps, and rubella (MMR) vaccine. You may need at least one dose of MMR if you were born in  1956 or later. You may also need a second dose.  Pneumococcal 13-valent conjugate (PCV13) vaccine. You may need this if you have certain conditions and were not previously vaccinated.  Pneumococcal polysaccharide (PPSV23) vaccine. You may need one or two doses if you smoke cigarettes or if you have certain conditions.  Meningococcal vaccine. You may need this if you have certain  conditions.  Hepatitis A vaccine. You may need this if you have certain conditions or if you travel or work in places where you may be exposed to hepatitis A.  Hepatitis B vaccine. You may need this if you have certain conditions or if you travel or work in places where you may be exposed to hepatitis B.  Haemophilus influenzae type b (Hib) vaccine. You may need this if you have certain conditions.  Talk to your health care provider about which screenings and vaccines you need and how often you need them. This information is not intended to replace advice given to you by your health care provider. Make sure you discuss any questions you have with your health care provider. Document Released: 08/14/2015 Document Revised: 04/06/2016 Document Reviewed: 05/19/2015 Elsevier Interactive Patient Education  2018 [employer]  

## 2018-02-19 NOTE — Progress Notes (Signed)
Name: [name] [name]   MRN: [id number]    DOB: 1966-04-05   Date:02/19/2018       Progress Note  Subjective  Chief Complaint  Chief Complaint  Patient presents with  . Annual Exam    patient sleeps about 5hrs/ night. patient tries to eat a well balance diet  . Labs Only    fasting  . Immunizations    patient declines  . Orders    patient declines mammogram & colonoscopy    HPI  Patient presents for annual CPE and follow up  Diet: skipping breakfast, salads for lunch and eating a lot more at night.  Exercise: usually 3 times a week the intention is for every day  Asthma: last flare of asthma was 09/2017, took prednisone and went back on Breo but is not taking any medication at this time. She denies cough, wheezing or SOB at this time.   HTN: she has been out of Norvasc, but has been taking losartan hctz daily, bp is still not at goal, we will resume Norvac and she will stop by for a nurse visit soon to recheck it. No chest pain, palpitation or SOB  Chronic DVT: she has been on Xareltol for years, seen by Dr. Lucky Cowboy and hematologist , had a stent placed because of recurrent DVT. Last episode was 2017 and is going to stay on medication. Denies easy bruising or blood in stools.    USPSTF grade A and B recommendations    Office Visit from 02/19/2018 in [facility]  AUDIT-C Score  0     Depression:  Depression screen [facility] 2/9 02/19/2018 09/11/2017 07/02/2015  Decreased Interest 0 0 0  Down, Depressed, Hopeless 0 0 0  PHQ - 2 Score 0 0 0  Altered sleeping 0 - -  Tired, decreased energy 0 - -  Change in appetite 0 - -  Feeling bad or failure about yourself  0 - -  Trouble concentrating 0 - -  Moving slowly or fidgety/restless 0 - -  Suicidal thoughts 0 - -  PHQ-9 Score 0 - -   Hypertension: BP Readings from Last 3 Encounters:  02/19/18 (!) 144/98  09/18/17 (!) 150/100  09/11/17 126/74   Obesity: Wt Readings from Last 3 Encounters:  02/19/18 170  lb 12.8 oz (77.5 kg)  09/18/17 170 lb 8 oz (77.3 kg)  09/11/17 167 lb 12.8 oz (76.1 kg)   BMI Readings from Last 3 Encounters:  02/19/18 28.98 kg/m  09/18/17 28.37 kg/m  09/11/17 27.92 kg/m     STD testing and prevention (HIV/chl/gon/syphilis): okay with HIV, but does not want the rest  Intimate partner violence:negative screen  Sexual History/Pain during Intercourse: not currently  Menstrual History/LMP/Abnormal Bleeding: no, regular cycles  Incontinence Symptoms: no problems   Advanced Care Planning: A voluntary discussion about advance care planning including the explanation and discussion of advance directives.  Discussed health care proxy and Living will, and the patient was able to identify a health care proxy as son Nash Dimmer)   Patient does not have a living will at present time.   Breast cancer:  HM Mammogram  Date Value Ref Range Status  08/02/2007 Normal  Final    BRCA gene screening: N/A Cervical cancer screening: today  Osteoporosis Screening:  No results found for: HMDEXASCAN  Lipids:  Lab Results  Component Value Date   CHOL 160 07/02/2015   CHOL 154 08/03/2012   Lab Results  Component Value Date  HDL 78 07/02/2015   HDL 51 08/03/2012   Lab Results  Component Value Date   LDLCALC 73 07/02/2015   LDLCALC 92 08/03/2012   Lab Results  Component Value Date   TRIG 43 07/02/2015   TRIG 53 08/03/2012   Lab Results  Component Value Date   CHOLHDL 2.1 07/02/2015   No results found for: LDLDIRECT  Glucose:  Glucose  Date Value Ref Range Status  07/02/2015 89 65 - 99 mg/dL Final   Glucose, Bld  Date Value Ref Range Status  09/11/2017 87 65 - 99 mg/dL Final    Skin cancer: discussed change in moles  Colorectal cancer: discussed options and she chose  Cologuard  Lung cancer:   Low Dose CT Chest recommended if Age 77-80 years, 30 pack-year currently smoking OR have quit w/in 15years. Patient does not qualify.   ECG: 09/2017   Patient  Active Problem List   Diagnosis Date Noted  . Hypertension 09/11/2017  . Adhesive capsulitis of right shoulder 05/12/2017  . Moderate intermittent asthma with acute exacerbation 07/02/2015  . Perennial allergic rhinitis with seasonal variation 07/02/2015  . Hypokalemia 07/02/2015  . History of DVT of lower extremity     Past Surgical History:  Procedure Laterality Date  . INSERTION OF VENA CAVA FILTER  04/2014   Dr. Lucky Cowboy   . IVC FILTER REMOVAL N/A 12/29/2016   Procedure: IVC Filter Removal;  Surgeon: Algernon Huxley, MD;  Location: [place] [facility];  Service: Cardiovascular;  Laterality: N/A;  . PERIPHERAL VASCULAR THROMBECTOMY Left 11/01/2016   Procedure: Peripheral Vascular Thrombectomy;  Surgeon: Algernon Huxley, MD;  Location: [place] [facility];  Service: Cardiovascular;  Laterality: Left;  . TUBAL LIGATION      Family History  Problem Relation Age of Onset  . Leukemia Son 3    Social History   Socioeconomic History  . Marital status: Single    Spouse name: Not on file  . Number of children: 3  . Years of education: Not on file  . Highest education level: Associate degree: occupational, Hotel manager, or vocational program  Occupational History  . Not on file  Social Needs  . Financial resource strain: Not hard at all  . Food insecurity:    Worry: Never true    Inability: Never true  . Transportation needs:    Medical: No    Non-medical: No  Tobacco Use  . Smoking status: Former Smoker    Years: 10.00    Types: Cigarettes    Last attempt to quit: 08/01/2008    Years since quitting: 9.5  . Smokeless tobacco: Never Used  Substance and Sexual Activity  . Alcohol use: Not Currently    Alcohol/week: 0.0 oz    Comment: many years ago  . Drug use: Never  . Sexual activity: Not Currently  Lifestyle  . Physical activity:    Days per week: 7 days    Minutes per session: 30 min  . Stress: Not at all  Relationships  . Social connections:    Talks on phone: More than three  times a week    Gets together: Once a week    Attends religious service: More than 4 times per year    Active member of club or organization: Yes    Attends meetings of clubs or organizations: 1 to 4 times per year    Relationship status: Divorced  . Intimate partner violence:    Fear of current or ex partner: No  Emotionally abused: No    Physically abused: No    Forced sexual activity: No  Other Topics Concern  . Not on file  Social History Narrative  . Not on file     Current Outpatient Medications:  .  acetaminophen (TYLENOL) 500 MG tablet, Take 1,000 mg by mouth 2 (two) times daily as needed for moderate pain., Disp: , Rfl:  .  albuterol (PROAIR HFA) 108 (90 Base) MCG/ACT inhaler, Inhale 2 puffs into the lungs every 6 (six) hours as needed for wheezing or shortness of breath., Disp: 1 Inhaler, Rfl: 0 .  amLODipine (NORVASC) 2.5 MG tablet, Take 1 tablet (2.5 mg total) by mouth daily., Disp: 90 tablet, Rfl: 1 .  fluticasone furoate-vilanterol (BREO ELLIPTA) 100-25 MCG/INH AEPB, Inhale 1 puff into the lungs daily., Disp: 60 each, Rfl: 2 .  loratadine (CLARITIN) 10 MG tablet, Take 1 tablet (10 mg total) by mouth daily., Disp: 30 tablet, Rfl: 5 .  losartan-hydrochlorothiazide (HYZAAR) 50-12.5 MG tablet, Take 1 tablet by mouth daily., Disp: 90 tablet, Rfl: 1 .  Multiple Vitamin (MULTIVITAMIN WITH MINERALS) TABS tablet, Take 1 tablet by mouth daily., Disp: , Rfl:  .  XARELTO 20 MG TABS tablet, Take 1 tablet (20 mg total) by mouth daily with supper., Disp: 90 tablet, Rfl: 2  No Known Allergies   ROS  Constitutional: Negative for fever or weight change.  Respiratory: Negative for cough and shortness of breath.   Cardiovascular: Negative for chest pain or palpitations.  Gastrointestinal: Negative for abdominal pain, no bowel changes.  Musculoskeletal: Negative for gait problem or joint swelling.  Skin: Negative for rash.  Neurological: Negative for dizziness or headache.  No  other specific complaints in a complete review of systems (except as listed in HPI above).  Objective  Vitals:   02/19/18 1023  BP: (!) 144/98  Pulse: 82  Resp: 16  Temp: 98.7 F (37.1 C)  TempSrc: Oral  SpO2: 98%  Weight: 170 lb 12.8 oz (77.5 kg)  Height: 5' 4.38" (1.635 m)    Body mass index is 28.98 kg/m.  Physical Exam  Constitutional: Patient appears well-developed and well-nourished. No distress.  HENT: Head: Normocephalic and atraumatic. Ears: B TMs ok, no erythema or effusion; Nose: Nose normal. Mouth/Throat: Oropharynx is clear and moist. No oropharyngeal exudate.  Eyes: Conjunctivae and EOM are normal. Pupils are equal, round, and reactive to light. No scleral icterus.  Neck: Normal range of motion. Neck supple. No JVD present. No thyromegaly present.  Cardiovascular: Normal rate, regular rhythm and normal heart sounds.  No murmur heard. No BLE edema. Pulmonary/Chest: Effort normal and breath sounds normal. No respiratory distress. Abdominal: Soft. Bowel sounds are normal, no distension. There is no tenderness. no masses Breast: no lumps or masses, no nipple discharge or rashes FEMALE GENITALIA:  External genitalia normal External urethra normal Vaginal vault normal without discharge or lesions Cervix normal without discharge or lesions Bimanual exam normal without masses RECTAL: not done Musculoskeletal: Normal range of motion, no joint effusions. No gross deformities Neurological: he is alert and oriented to person, place, and time. No cranial nerve deficit. Coordination, balance, strength, speech and gait are normal.  Skin: Skin is warm and dry. No rash noted. No erythema.  Psychiatric: Patient has a normal mood and affect. behavior is normal. Judgment and thought content normal.  PHQ2/9: Depression screen [facility] 2/9 02/19/2018 09/11/2017 07/02/2015  Decreased Interest 0 0 0  Down, Depressed, Hopeless 0 0 0  PHQ - 2 Score 0 0  0  Altered sleeping 0 - -  Tired,  decreased energy 0 - -  Change in appetite 0 - -  Feeling bad or failure about yourself  0 - -  Trouble concentrating 0 - -  Moving slowly or fidgety/restless 0 - -  Suicidal thoughts 0 - -  PHQ-9 Score 0 - -    Fall Risk: Fall Risk  02/19/2018 09/18/2017 09/11/2017 07/02/2015  Falls in the past year? No No No No     Functional Status Survey: Is the patient deaf or have difficulty hearing?: No Does the patient have difficulty seeing, even when wearing glasses/contacts?: No Does the patient have difficulty concentrating, remembering, or making decisions?: No Does the patient have difficulty walking or climbing stairs?: No Does the patient have difficulty dressing or bathing?: No Does the patient have difficulty doing errands alone such as visiting a doctor's office or shopping?: No   Assessment & Plan  1. Well woman exam  Discussed importance of 150 minutes of physical activity weekly, eat two servings of fish weekly, eat one serving of tree nuts ( cashews, pistachios, pecans, almonds.Marland Kitchen) every other day, eat 6 servings of fruit/vegetables daily and drink plenty of water and avoid sweet beverages.  - Vitamin B12 - VITAMIN D 25 Hydroxy (Vit-D Deficiency, Fractures) - TSH - HIV antibody - Hemoglobin A1c - Lipid panel  2. Essential hypertension  - amLODipine (NORVASC) 2.5 MG tablet; Take 1 tablet (2.5 mg total) by mouth daily.  Dispense: 90 tablet; Refill: 1 - losartan-hydrochlorothiazide (HYZAAR) 50-12.5 MG tablet; Take 1 tablet by mouth daily.  Dispense: 90 tablet; Refill: 1 - CBC with Differential/Platelet - Comprehensive metabolic panel  3. Moderate intermittent asthma with acute exacerbation  - Spirometry with Graph  4. Chronic deep vein thrombosis (DVT) of left lower extremity, unspecified vein (HCC)  Seeing Dr. Lucky Cowboy, on Xarelto, s/p stent placement   5. Anticoagulant long-term use   6. Cervical cancer screening  - Pap IG and HPV (high risk) DNA detection  7.  Colon cancer screening  - Cologuard  8. Breast cancer screening  - MM DIGITAL SCREENING BILATERAL; Future  9. Need for pneumococcal vaccine  refused

## 2018-02-21 LAB — PAP IG AND HPV HIGH-RISK
HPV, HIGH-RISK: NEGATIVE
PAP Smear Comment: 0

## 2018-02-21 LAB — SPECIMEN STATUS REPORT

## 2018-05-09 ENCOUNTER — Ambulatory Visit: Payer: 59 | Admitting: Family Medicine

## 2018-05-09 ENCOUNTER — Encounter: Payer: Self-pay | Admitting: Family Medicine

## 2018-05-09 DIAGNOSIS — J4521 Mild intermittent asthma with (acute) exacerbation: Secondary | ICD-10-CM | POA: Diagnosis not present

## 2018-05-09 DIAGNOSIS — I10 Essential (primary) hypertension: Secondary | ICD-10-CM | POA: Diagnosis not present

## 2018-05-09 DIAGNOSIS — IMO0001 Reserved for inherently not codable concepts without codable children: Secondary | ICD-10-CM

## 2018-05-09 MED ORDER — ALBUTEROL SULFATE HFA 108 (90 BASE) MCG/ACT IN AERS: 2.0000 | INHALATION_SPRAY | Freq: Four times a day (QID) | RESPIRATORY_TRACT | 0 refills | Status: DC | PRN

## 2018-05-09 MED ORDER — FLUTICASONE FUROATE-VILANTEROL 100-25 MCG/INH IN AEPB: 1.0000 | INHALATION_SPRAY | Freq: Every day | RESPIRATORY_TRACT | 2 refills | Status: DC

## 2018-05-09 MED ORDER — PREDNISONE 20 MG PO TABS: 20.0000 mg | ORAL_TABLET | Freq: Two times a day (BID) | ORAL | 0 refills | Status: DC

## 2018-05-09 NOTE — Progress Notes (Signed)
Name: [name] [name]   MRN: [id number]    DOB: [birth year]   Date:05/09/2018       Progress Note  Subjective  Chief Complaint  Chief Complaint  Patient presents with  . Asthma    Allergy Trigger-Right Before Fall and in the Spring time.   . Shortness of Breath    SOB and Chest Tightness, Dry Cough    HPI  Asthma Moderate : she has been out of Breo, she usually has a flare in the beginning of Spring and Fall. She states this episode started 3 days ago, initially mild cough, but over the past 2 days getting much worse with frequent cough, chest tightness and some wheezing right before a cough spell. No fever or chills. She has a history of DVT but is on Xarelto and denies calf pain, she states it feels just like her asthma flare.   HTN: she is now on losartan hctz and norvasc, bp has improved but sbp still a little above goal, however not feeling well today, we will hold off on adjusting dose until next time   Patient Active Problem List   Diagnosis Date Noted  . Hypertension 09/11/2017  . Adhesive capsulitis of right shoulder 05/12/2017  . Moderate intermittent asthma with acute exacerbation 07/02/2015  . Perennial allergic rhinitis with seasonal variation 07/02/2015  . Hypokalemia 07/02/2015  . History of DVT of lower extremity     Past Surgical History:  Procedure Laterality Date  . INSERTION OF VENA CAVA FILTER  04/2014   Dr. Wyn Quaker   . IVC FILTER REMOVAL N/A 12/29/2016   Procedure: IVC Filter Removal;  Surgeon: Annice Needy, MD;  Location: [facility];  Service: Cardiovascular;  Laterality: N/A;  . PERIPHERAL VASCULAR THROMBECTOMY Left 11/01/2016   Procedure: Peripheral Vascular Thrombectomy;  Surgeon: Annice Needy, MD;  Location: [facility];  Service: Cardiovascular;  Laterality: Left;  . TUBAL LIGATION      Family History  Problem Relation Age of Onset  . Leukemia Son 3    Social History   Socioeconomic History  . Marital status: Single   Spouse name: Not on file  . Number of children: 3  . Years of education: Not on file  . Highest education level: Associate degree: occupational, Scientist, product/process development, or vocational program  Occupational History  . Not on file  Social Needs  . Financial resource strain: Not hard at all  . Food insecurity:    Worry: Never true    Inability: Never true  . Transportation needs:    Medical: No    Non-medical: No  Tobacco Use  . Smoking status: Former Smoker    Years: 10.00    Types: Cigarettes    Last attempt to quit: 08/01/2008    Years since quitting: 9.7  . Smokeless tobacco: Never Used  Substance and Sexual Activity  . Alcohol use: Not Currently    Alcohol/week: 0.0 standard drinks    Comment: many years ago  . Drug use: Never  . Sexual activity: Not Currently  Lifestyle  . Physical activity:    Days per week: 7 days    Minutes per session: 30 min  . Stress: Not at all  Relationships  . Social connections:    Talks on phone: More than three times a week    Gets together: Once a week    Attends religious service: More than 4 times per year    Active member of club or organization: Yes  Attends meetings of clubs or organizations: 1 to 4 times per year    Relationship status: Divorced  . Intimate partner violence:    Fear of current or ex partner: No    Emotionally abused: No    Physically abused: No    Forced sexual activity: No  Other Topics Concern  . Not on file  Social History Narrative  . Not on file     Current Outpatient Medications:  .  acetaminophen (TYLENOL) 500 MG tablet, Take 1,000 mg by mouth 2 (two) times daily as needed for moderate pain., Disp: , Rfl:  .  albuterol (PROAIR HFA) 108 (90 Base) MCG/ACT inhaler, Inhale 2 puffs into the lungs every 6 (six) hours as needed for wheezing or shortness of breath., Disp: 1 Inhaler, Rfl: 0 .  amLODipine (NORVASC) 2.5 MG tablet, Take 1 tablet (2.5 mg total) by mouth daily., Disp: 90 tablet, Rfl: 1 .  fluticasone  furoate-vilanterol (BREO ELLIPTA) 100-25 MCG/INH AEPB, Inhale 1 puff into the lungs daily., Disp: 60 each, Rfl: 2 .  loratadine (CLARITIN) 10 MG tablet, Take 1 tablet (10 mg total) by mouth daily., Disp: 30 tablet, Rfl: 5 .  losartan-hydrochlorothiazide (HYZAAR) 50-12.5 MG tablet, Take 1 tablet by mouth daily., Disp: 90 tablet, Rfl: 1 .  Multiple Vitamin (MULTIVITAMIN WITH MINERALS) TABS tablet, Take 1 tablet by mouth daily., Disp: , Rfl:  .  XARELTO 20 MG TABS tablet, Take 1 tablet (20 mg total) by mouth daily with supper., Disp: 90 tablet, Rfl: 2 .  predniSONE (DELTASONE) 20 MG tablet, Take 1 tablet (20 mg total) by mouth 2 (two) times daily with a meal., Disp: 10 tablet, Rfl: 0  No Known Allergies  I personally reviewed active problem list, medication list, allergies, family history, social history with the patient/caregiver today.   ROS  Ten systems reviewed and is negative except as mentioned in HPI   Objective  Vitals:   05/09/18 1404  BP: (!) 144/82  Pulse: (!) 103  Resp: 18  Temp: 98.7 F (37.1 C)  TempSrc: Oral  SpO2: 98%  Weight: 168 lb (76.2 kg)  Height: 5\' 4"  (1.626 m)    Body mass index is 28.84 kg/m.  Physical Exam  Constitutional: Patient appears well-developed and well-nourished. Overweight.  No distress.  HEENT: head atraumatic, normocephalic, pupils equal and reactive to light, neck supple, throat within normal limits Cardiovascular: Normal rate, regular rhythm and normal heart sounds.  No murmur heard. No BLE edema. No calf tenderness  Pulmonary/Chest: Effort normal and breath sounds normal. No respiratory distress. Abdominal: Soft.  There is no tenderness. Psychiatric: Patient has a normal mood and affect. behavior is normal. Judgment and thought content normal.  Recent Results (from the past 2160 hour(s))  Pap IG and HPV (high risk) DNA detection     Status: None   Collection Time: 02/19/18 12:00 AM  Result Value Ref Range   DIAGNOSIS: Comment      Comment: NEGATIVE FOR INTRAEPITHELIAL LESION OR MALIGNANCY.   Specimen adequacy: Comment     Comment: Satisfactory for evaluation. Endocervical and/or squamous metaplastic cells (endocervical component) are present.    Clinician Provided ICD10 Comment     Comment: Z12.4   Performed by: Comment     Comment: Patriciaann Clan, Cytotechnologist (ASCP)   PAP Smear Comment .    Note: Comment     Comment: The Pap smear is a screening test designed to aid in the detection of premalignant and malignant conditions of the uterine cervix.  It is not a diagnostic procedure and should not be used as the sole means of detecting cervical cancer.  Both false-positive and false-negative reports do occur.    Test Methodology Comment     Comment: This liquid based ThinPrep(R) pap test was screened with the use of an image guided system.    HPV, high-risk Negative Negative    Comment: This high-risk HPV test detects thirteen high-risk types (16/18/31/33/35/39/45/51/52/56/58/59/68) without differentiation.   Specimen status report     Status: None   Collection Time: 02/19/18 12:00 AM  Result Value Ref Range   specimen status report Comment     Comment: Please note Please note The date and/or time of collection was not indicated on the requisition as required by state and federal law.  The date of receipt of the specimen was used as the collection date if not supplied.       PHQ2/9: Depression screen [facility] 2/9 05/09/2018 02/19/2018 09/11/2017 07/02/2015  Decreased Interest 0 0 0 0  Down, Depressed, Hopeless 0 0 0 0  PHQ - 2 Score 0 0 0 0  Altered sleeping - 0 - -  Tired, decreased energy - 0 - -  Change in appetite - 0 - -  Feeling bad or failure about yourself  - 0 - -  Trouble concentrating - 0 - -  Moving slowly or fidgety/restless - 0 - -  Suicidal thoughts - 0 - -  PHQ-9 Score - 0 - -    Fall Risk: Fall Risk  05/09/2018 02/19/2018 09/18/2017 09/11/2017 07/02/2015  Falls in the past year? No No  No No No     Functional Status Survey: Is the patient deaf or have difficulty hearing?: No Does the patient have difficulty seeing, even when wearing glasses/contacts?: Yes(contacts) Does the patient have difficulty concentrating, remembering, or making decisions?: No Does the patient have difficulty walking or climbing stairs?: No Does the patient have difficulty dressing or bathing?: No Does the patient have difficulty doing errands alone such as visiting a doctor's office or shopping?: No    Assessment & Plan  1. Moderate intermittent asthma with acute exacerbation  - fluticasone furoate-vilanterol (BREO ELLIPTA) 100-25 MCG/INH AEPB; Inhale 1 puff into the lungs daily.  Dispense: 60 each; Refill: 2 - albuterol (PROAIR HFA) 108 (90 Base) MCG/ACT inhaler; Inhale 2 puffs into the lungs every 6 (six) hours as needed for wheezing or shortness of breath.  Dispense: 1 Inhaler; Refill: 0 - predniSONE (DELTASONE) 20 MG tablet; Take 1 tablet (20 mg total) by mouth 2 (two) times daily with a meal.  Dispense: 10 tablet; Refill: 0  2. Essential hypertension  bp slightly up, taking medication we will hold off on adjusting dose until her follow up in 2 months

## 2018-05-14 ENCOUNTER — Telehealth: Payer: Self-pay

## 2018-05-14 DIAGNOSIS — IMO0001 Reserved for inherently not codable concepts without codable children: Secondary | ICD-10-CM

## 2018-05-14 DIAGNOSIS — J4521 Mild intermittent asthma with (acute) exacerbation: Secondary | ICD-10-CM

## 2018-05-14 MED ORDER — PREDNISONE 20 MG PO TABS: 20.0000 mg | ORAL_TABLET | Freq: Two times a day (BID) | ORAL | 0 refills | Status: DC

## 2018-05-14 NOTE — Telephone Encounter (Signed)
Sent enough for one day, but she needs to be seen tomorrow

## 2018-05-14 NOTE — Telephone Encounter (Signed)
Copied from CRM [id number]. Topic: Quick Communication - Rx Refill/Question >> May 14, 2018  2:09 PM Alexander Bergeron B wrote: Medication: prednisone   Pt called about the medication above; pt is needing another round of medication; pt is inquiring if another visit is needed or if the medication can just be called in; contact to advise  Preferred Pharmacy (with phone number or street name): [facility]   Agent: Please be advised that RX refills may take up to 3 business days. We ask that you follow-up with your pharmacy.  Please advise

## 2018-05-15 NOTE — Telephone Encounter (Signed)
Pt calling and states she will not be able to come today at 12. She states if she needs an appt she will call back to schedule for a different day and time.

## 2018-05-15 NOTE — Telephone Encounter (Signed)
Please schedule for an appt today if possible with one of the NPs or out her in a lunch slot

## 2018-05-15 NOTE — Telephone Encounter (Signed)
Called [phone] @ 8:35 lvm asking pt if she can be seen today during Dr Carlynn Purl lunch (12p).

## 2018-10-05 ENCOUNTER — Ambulatory Visit (INDEPENDENT_AMBULATORY_CARE_PROVIDER_SITE_OTHER): Payer: BLUE CROSS/BLUE SHIELD | Admitting: Family Medicine

## 2018-10-05 ENCOUNTER — Encounter: Payer: Self-pay | Admitting: Family Medicine

## 2018-10-05 VITALS — BP 150/100 | HR 90 | Temp 98.9°F | Resp 16 | Ht 64.0 in | Wt 180.8 lb

## 2018-10-05 DIAGNOSIS — I10 Essential (primary) hypertension: Secondary | ICD-10-CM

## 2018-10-05 DIAGNOSIS — I82502 Chronic embolism and thrombosis of unspecified deep veins of left lower extremity: Secondary | ICD-10-CM | POA: Diagnosis not present

## 2018-10-05 DIAGNOSIS — J4521 Mild intermittent asthma with (acute) exacerbation: Secondary | ICD-10-CM | POA: Diagnosis not present

## 2018-10-05 DIAGNOSIS — IMO0001 Reserved for inherently not codable concepts without codable children: Secondary | ICD-10-CM

## 2018-10-05 MED ORDER — PREDNISONE 20 MG PO TABS: 20.0000 mg | ORAL_TABLET | Freq: Two times a day (BID) | ORAL | 0 refills | Status: DC

## 2018-10-05 MED ORDER — FLUTICASONE FUROATE-VILANTEROL 100-25 MCG/INH IN AEPB: 1.0000 | INHALATION_SPRAY | Freq: Every day | RESPIRATORY_TRACT | 1 refills | Status: DC

## 2018-10-05 MED ORDER — LOSARTAN POTASSIUM-HCTZ 50-12.5 MG PO TABS: 1.0000 | ORAL_TABLET | Freq: Every day | ORAL | 1 refills | Status: DC

## 2018-10-05 MED ORDER — MONTELUKAST SODIUM 10 MG PO TABS: 10.0000 mg | ORAL_TABLET | Freq: Every day | ORAL | 0 refills | Status: DC

## 2018-10-05 MED ORDER — AMLODIPINE BESYLATE 2.5 MG PO TABS: 2.5000 mg | ORAL_TABLET | Freq: Every day | ORAL | 1 refills | Status: DC

## 2018-10-05 NOTE — Progress Notes (Signed)
Name: [name] [name]   MRN: [id number]    DOB: 1965/11/18   Date:10/05/2018       Progress Note  Subjective  Chief Complaint  Chief Complaint  Patient presents with  . Chest Congestion  . Bronchitis    She has flares when the season changes. Symptoms include: cough, sob, chest tightness x 1 week.    HPI  Asthma: last flare of asthma was 05/2018 previous 09/2017 - she states it happens every Spring and Fall and responds well on prednisone. She states taking Breo as prescribed, she has noticed dry cough, wheezing and SOB. She denies URI preceding symptoms this time, but she has some allergy symptoms with sneezing.    HTN: she states that she has been taking medications, however last rx was sent in July for 6 months. She is having tightness from asthma flare, no palpitation. She denies headaches.   Chronic DVT: she has been on Xareltol for years, seen by Dr. Wyn Quaker and hematologist , history of IVF placed and removed and also had a stent placed because of recurrent DVT. Last episode was 2017 and is going to stay on medication. Denies easy bruising or blood in stools.    Patient Active Problem List   Diagnosis Date Noted  . Hypertension 09/11/2017  . Adhesive capsulitis of right shoulder 05/12/2017  . Moderate intermittent asthma with acute exacerbation 07/02/2015  . Perennial allergic rhinitis with seasonal variation 07/02/2015  . Hypokalemia 07/02/2015  . History of DVT of lower extremity     Past Surgical History:  Procedure Laterality Date  . INSERTION OF VENA CAVA FILTER  04/2014   Dr. Wyn Quaker   . IVC FILTER REMOVAL N/A 12/29/2016   Procedure: IVC Filter Removal;  Surgeon: Annice Needy, MD;  Location: [facility] INVASIVE CV LAB;  Service: Cardiovascular;  Laterality: N/A;  . PERIPHERAL VASCULAR THROMBECTOMY Left 11/01/2016   Procedure: Peripheral Vascular Thrombectomy;  Surgeon: Annice Needy, MD;  Location: [facility] INVASIVE CV LAB;  Service: Cardiovascular;  Laterality: Left;  . TUBAL  LIGATION      Family History  Problem Relation Age of Onset  . Leukemia Son 3    Social History   Socioeconomic History  . Marital status: Single    Spouse name: Not on file  . Number of children: 3  . Years of education: Not on file  . Highest education level: Associate degree: occupational, Scientist, product/process development, or vocational program  Occupational History  . Not on file  Social Needs  . Financial resource strain: Not hard at all  . Food insecurity:    Worry: Never true    Inability: Never true  . Transportation needs:    Medical: No    Non-medical: No  Tobacco Use  . Smoking status: Former Smoker    Years: 10.00    Types: Cigarettes    Last attempt to quit: 08/01/2008    Years since quitting: 10.1  . Smokeless tobacco: Never Used  Substance and Sexual Activity  . Alcohol use: Not Currently    Alcohol/week: 0.0 standard drinks    Comment: many years ago  . Drug use: Never  . Sexual activity: Not Currently  Lifestyle  . Physical activity:    Days per week: 7 days    Minutes per session: 30 min  . Stress: Not at all  Relationships  . Social connections:    Talks on phone: More than three times a week    Gets together: Once a week  Attends religious service: More than 4 times per year    Active member of club or organization: Yes    Attends meetings of clubs or organizations: 1 to 4 times per year    Relationship status: Divorced  . Intimate partner violence:    Fear of current or ex partner: No    Emotionally abused: No    Physically abused: No    Forced sexual activity: No  Other Topics Concern  . Not on file  Social History Narrative  . Not on file     Current Outpatient Medications:  .  acetaminophen (TYLENOL) 500 MG tablet, Take 1,000 mg by mouth 2 (two) times daily as needed for moderate pain., Disp: , Rfl:  .  albuterol (PROAIR HFA) 108 (90 Base) MCG/ACT inhaler, Inhale 2 puffs into the lungs every 6 (six) hours as needed for wheezing or shortness of  breath., Disp: 1 Inhaler, Rfl: 0 .  amLODipine (NORVASC) 2.5 MG tablet, Take 1 tablet (2.5 mg total) by mouth daily., Disp: 90 tablet, Rfl: 1 .  fluticasone furoate-vilanterol (BREO ELLIPTA) 100-25 MCG/INH AEPB, Inhale 1 puff into the lungs daily., Disp: 60 each, Rfl: 2 .  loratadine (CLARITIN) 10 MG tablet, Take 1 tablet (10 mg total) by mouth daily., Disp: 30 tablet, Rfl: 5 .  losartan-hydrochlorothiazide (HYZAAR) 50-12.5 MG tablet, Take 1 tablet by mouth daily., Disp: 90 tablet, Rfl: 1 .  Multiple Vitamin (MULTIVITAMIN WITH MINERALS) TABS tablet, Take 1 tablet by mouth daily., Disp: , Rfl:  .  XARELTO 20 MG TABS tablet, Take 1 tablet (20 mg total) by mouth daily with supper., Disp: 90 tablet, Rfl: 2 .  predniSONE (DELTASONE) 20 MG tablet, Take 1 tablet (20 mg total) by mouth 2 (two) times daily with a meal. (Patient not taking: Reported on 10/05/2018), Disp: 2 tablet, Rfl: 0  No Known Allergies  I personally reviewed active problem list, medication list, allergies, family history, social history with the patient/caregiver today.   ROS  Constitutional: Negative for fever or weight change.  Respiratory: positive  for cough and shortness of breath.   Cardiovascular: Negative for chest pain or palpitations.  Gastrointestinal: Negative for abdominal pain, no bowel changes.  Musculoskeletal: Negative for gait problem or joint swelling.  Skin: Negative for rash.  Neurological: Negative for dizziness or headache.  No other specific complaints in a complete review of systems (except as listed in HPI above).  Objective  Vitals:   10/05/18 0947  BP: (!) 150/100  Pulse: 90  Resp: 16  Temp: 98.9 F (37.2 C)  TempSrc: Oral  SpO2: 98%  Weight: 180 lb 12.8 oz (82 kg)  Height: 5\' 4"  (1.626 m)    Body mass index is 31.03 kg/m.  Physical Exam  Constitutional: Patient appears well-developed and well-nourished. Obese  No distress.  HEENT: head atraumatic, normocephalic, pupils equal and  reactive to light,neck supple, throat within normal limits Cardiovascular: Normal rate, regular rhythm and normal heart sounds.  No murmur heard. No BLE edema. Pulmonary/Chest: Effort normal and breath sounds normal. No respiratory distress. Suprisingly clear to auscultation  Abdominal: Soft.  There is no tenderness. Psychiatric: Patient has a normal mood and affect. behavior is normal. Judgment and thought content normal.  PHQ2/9: Depression screen [facility] 2/9 10/05/2018 05/09/2018 02/19/2018 09/11/2017 07/02/2015  Decreased Interest 0 0 0 0 0  Down, Depressed, Hopeless 0 0 0 0 0  PHQ - 2 Score 0 0 0 0 0  Altered sleeping 0 - 0 - -  Tired,  decreased energy 0 - 0 - -  Change in appetite 0 - 0 - -  Feeling bad or failure about yourself  0 - 0 - -  Trouble concentrating 0 - 0 - -  Moving slowly or fidgety/restless 0 - 0 - -  Suicidal thoughts 0 - 0 - -  PHQ-9 Score 0 - 0 - -     Fall Risk: Fall Risk  05/09/2018 02/19/2018 09/18/2017 09/11/2017 07/02/2015  Falls in the past year? No No No No No    Assessment & Plan  1. Essential hypertension  - amLODipine (NORVASC) 2.5 MG tablet; Take 1 tablet (2.5 mg total) by mouth daily.  Dispense: 90 tablet; Refill: 1 - losartan-hydrochlorothiazide (HYZAAR) 50-12.5 MG tablet; Take 1 tablet by mouth daily.  Dispense: 90 tablet; Refill: 1  2. Moderate intermittent asthma with acute exacerbation  - predniSONE (DELTASONE) 20 MG tablet; Take 1 tablet (20 mg total) by mouth 2 (two) times daily with a meal.  Dispense: 10 tablet; Refill: 0 - fluticasone furoate-vilanterol (BREO ELLIPTA) 100-25 MCG/INH AEPB; Inhale 1 puff into the lungs daily.  Dispense: 180 each; Refill: 1 Resume singulair   3. Chronic deep vein thrombosis (DVT) of left lower extremity, unspecified vein (HCC)  On chronic Xarelto , under the care of

## 2018-10-28 ENCOUNTER — Other Ambulatory Visit: Payer: Self-pay | Admitting: Family Medicine

## 2018-10-28 DIAGNOSIS — I10 Essential (primary) hypertension: Secondary | ICD-10-CM

## 2018-11-05 ENCOUNTER — Ambulatory Visit: Payer: BLUE CROSS/BLUE SHIELD | Admitting: Family Medicine

## 2018-11-15 ENCOUNTER — Other Ambulatory Visit: Payer: Self-pay | Admitting: Family Medicine

## 2018-11-15 DIAGNOSIS — J4521 Mild intermittent asthma with (acute) exacerbation: Secondary | ICD-10-CM

## 2018-11-15 DIAGNOSIS — IMO0001 Reserved for inherently not codable concepts without codable children: Secondary | ICD-10-CM

## 2018-11-24 ENCOUNTER — Other Ambulatory Visit: Payer: Self-pay | Admitting: Family Medicine

## 2018-11-24 DIAGNOSIS — J4521 Mild intermittent asthma with (acute) exacerbation: Secondary | ICD-10-CM

## 2018-11-24 DIAGNOSIS — IMO0001 Reserved for inherently not codable concepts without codable children: Secondary | ICD-10-CM

## 2018-11-25 ENCOUNTER — Other Ambulatory Visit: Payer: Self-pay | Admitting: Family Medicine

## 2018-11-25 DIAGNOSIS — IMO0001 Reserved for inherently not codable concepts without codable children: Secondary | ICD-10-CM

## 2018-11-25 DIAGNOSIS — J4521 Mild intermittent asthma with (acute) exacerbation: Secondary | ICD-10-CM

## 2019-02-25 ENCOUNTER — Encounter: Payer: BLUE CROSS/BLUE SHIELD | Admitting: Family Medicine

## 2019-03-05 ENCOUNTER — Other Ambulatory Visit: Payer: Self-pay | Admitting: Family Medicine

## 2019-03-05 DIAGNOSIS — IMO0001 Reserved for inherently not codable concepts without codable children: Secondary | ICD-10-CM

## 2019-03-05 DIAGNOSIS — J4521 Mild intermittent asthma with (acute) exacerbation: Secondary | ICD-10-CM

## 2019-03-26 ENCOUNTER — Other Ambulatory Visit: Payer: Self-pay | Admitting: Family Medicine

## 2019-03-26 DIAGNOSIS — IMO0001 Reserved for inherently not codable concepts without codable children: Secondary | ICD-10-CM

## 2019-03-26 DIAGNOSIS — J4521 Mild intermittent asthma with (acute) exacerbation: Secondary | ICD-10-CM

## 2019-03-26 NOTE — Telephone Encounter (Signed)
Requested medication (s) are due for refill today: yes  Requested medication (s) are on the active medication list: yes  Last refill:  03/03/2019  Future visit scheduled:yes  Notes to clinic:patient is requesting refills earlier   Requested Prescriptions  Pending Prescriptions Disp Refills   albuterol (VENTOLIN HFA) 108 (90 Base) MCG/ACT inhaler [Pharmacy Med Name: ALBUTEROL HFA INH (200 PUFFS) 8.5GM] 8.5 g 0    Sig: INHALE 2 PUFFS BY MOUTH EVERY 6 HOURS AS NEEDED FOR WHEEZING OR SHORTNESS OF BREATH     Pulmonology:  Beta Agonists Failed - 03/26/2019 11:35 AM      Failed - One inhaler should last at least one month. If the patient is requesting refills earlier, contact the patient to check for uncontrolled symptoms.      Passed - Valid encounter within last 12 months    Recent Outpatient Visits          5 months ago Chronic deep vein thrombosis (DVT) of left lower extremity, unspecified vein [facility])   [facility], Drue Stager, MD   10 months ago Moderate intermittent asthma with acute exacerbation   [facility] Steele Sizer, MD   1 year ago Need for pneumococcal vaccine   [facility] Steele Sizer, MD   1 year ago Moderate intermittent asthma with acute exacerbation   [facility] Steele Sizer, MD   1 year ago Chest pain, unspecified type   [facility], [place]      Future Appointments            In 1 month Steele Sizer, MD [facility], [facility]

## 2019-05-03 ENCOUNTER — Encounter

## 2019-05-03 ENCOUNTER — Ambulatory Visit
Admission: RE | Admit: 2019-05-03 | Discharge: 2019-05-03 | Disposition: A | Discharge status: Home / Self Care | Payer: BC Managed Care – PPO | Source: Ambulatory Visit | Attending: Family Medicine | Admitting: Family Medicine

## 2019-05-03 ENCOUNTER — Other Ambulatory Visit: Payer: Self-pay

## 2019-05-03 ENCOUNTER — Ambulatory Visit (INDEPENDENT_AMBULATORY_CARE_PROVIDER_SITE_OTHER): Payer: BC Managed Care – PPO | Admitting: Family Medicine

## 2019-05-03 ENCOUNTER — Other Ambulatory Visit: Payer: Self-pay | Admitting: Family Medicine

## 2019-05-03 ENCOUNTER — Ambulatory Visit
Admission: RE | Admit: 2019-05-03 | Discharge: 2019-05-03 | Disposition: A | Discharge status: Home / Self Care | Payer: BC Managed Care – PPO | Attending: Family Medicine | Admitting: Family Medicine

## 2019-05-03 ENCOUNTER — Encounter: Payer: Self-pay | Admitting: Family Medicine

## 2019-05-03 VITALS — BP 126/74 | HR 71 | Temp 97.7°F | Resp 16 | Ht 64.0 in | Wt 188.4 lb

## 2019-05-03 DIAGNOSIS — Z5320 Procedure and treatment not carried out because of patient's decision for unspecified reasons: Secondary | ICD-10-CM

## 2019-05-03 DIAGNOSIS — R05 Cough: Secondary | ICD-10-CM | POA: Insufficient documentation

## 2019-05-03 DIAGNOSIS — Z1231 Encounter for screening mammogram for malignant neoplasm of breast: Secondary | ICD-10-CM

## 2019-05-03 DIAGNOSIS — I10 Essential (primary) hypertension: Secondary | ICD-10-CM

## 2019-05-03 DIAGNOSIS — Z23 Encounter for immunization: Secondary | ICD-10-CM | POA: Diagnosis not present

## 2019-05-03 DIAGNOSIS — R059 Cough, unspecified: Secondary | ICD-10-CM

## 2019-05-03 DIAGNOSIS — Z1159 Encounter for screening for other viral diseases: Secondary | ICD-10-CM

## 2019-05-03 DIAGNOSIS — Z7901 Long term (current) use of anticoagulants: Secondary | ICD-10-CM

## 2019-05-03 DIAGNOSIS — Z131 Encounter for screening for diabetes mellitus: Secondary | ICD-10-CM

## 2019-05-03 DIAGNOSIS — I82502 Chronic embolism and thrombosis of unspecified deep veins of left lower extremity: Secondary | ICD-10-CM | POA: Diagnosis not present

## 2019-05-03 DIAGNOSIS — Z13220 Encounter for screening for lipoid disorders: Secondary | ICD-10-CM

## 2019-05-03 DIAGNOSIS — Z1211 Encounter for screening for malignant neoplasm of colon: Secondary | ICD-10-CM

## 2019-05-03 DIAGNOSIS — Z0000 Encounter for general adult medical examination without abnormal findings: Secondary | ICD-10-CM

## 2019-05-03 DIAGNOSIS — J4540 Moderate persistent asthma, uncomplicated: Secondary | ICD-10-CM

## 2019-05-03 MED ORDER — AMLODIPINE BESYLATE 2.5 MG PO TABS: 2.5000 mg | ORAL_TABLET | Freq: Every day | ORAL | 1 refills | Status: DC

## 2019-05-03 MED ORDER — LOSARTAN POTASSIUM-HCTZ 50-12.5 MG PO TABS: 1.0000 | ORAL_TABLET | Freq: Every day | ORAL | 1 refills | Status: DC

## 2019-05-03 MED ORDER — BREO ELLIPTA 100-25 MCG/INH IN AEPB: 1.0000 | INHALATION_SPRAY | Freq: Every day | RESPIRATORY_TRACT | 1 refills | Status: DC

## 2019-05-03 MED ORDER — MONTELUKAST SODIUM 10 MG PO TABS: 10.0000 mg | ORAL_TABLET | Freq: Every day | ORAL | 1 refills | Status: DC

## 2019-05-03 NOTE — Progress Notes (Signed)
Name: [name] [name]   MRN: [id number]    DOB: 1965-10-31   Date:05/03/2019       Progress Note  Subjective  Chief Complaint  Chief Complaint  Patient presents with  . Annual Exam    HPI  Patient presents for annual CPE and follow up  Asthma: she is doing well Breo and prn albuterol, no recent flares of asthma, denies cough, wheezing or SOB. She uses rescue inhaler a few times per month   HTN: she states that she has been taking medications, bp has been well controlled, no dizziness, it went down 116/76 once. No decrease in exercise tolerance   Chronic DVT: she has been on Xareltol for years, seen by Dr. Lucky Cowboy and hematologist , history of IVF placed and removed and also had a stent placed because of recurrent DVT. Last episode was 2017 and is going to stay on medication. Denies easy bruising or blood in stools.Unchanged . She has been taking tumeric and advised to contact hematologist to make sure does not interact with Xarelto   Chest soreness: she states she has episodes of having the chest feeling sore for about 24 hours , it feels like after a coughing spell but without having a coughing spell. Not burning /not associated with meals. Not associated with SOB, wheezing, chest tightness, diaphoresis or tightness. Pain is constant. She states improves with ibuprofen. It happens a few times a month at most   Obesity: she has gained almost 20 lbs since 2018. She is not eating differently and not sure why she is gaining weight. Likely because she has turned 15, discussed importance of healthy diet and physical activity  Diet: skips meals Exercise: not active   USPSTF grade A and B recommendations    Office Visit from 05/03/2019 in [facility]  AUDIT-C Score  0     Depression: Phq 9 is  negative Depression screen [facility] 2/9 05/03/2019 10/05/2018 05/09/2018 02/19/2018 09/11/2017  Decreased Interest 0 0 0 0 0  Down, Depressed, Hopeless 0 0 0 0 0  PHQ - 2 Score 0 0  0 0 0  Altered sleeping 0 0 - 0 -  Tired, decreased energy 0 0 - 0 -  Change in appetite 0 0 - 0 -  Feeling bad or failure about yourself  0 0 - 0 -  Trouble concentrating 0 0 - 0 -  Moving slowly or fidgety/restless 0 0 - 0 -  Suicidal thoughts 0 0 - 0 -  PHQ-9 Score 0 0 - 0 -  Difficult doing work/chores Not difficult at all - - - -   Hypertension: BP Readings from Last 3 Encounters:  05/03/19 126/74  10/05/18 (!) 150/100  05/09/18 (!) 144/82   Obesity: Wt Readings from Last 3 Encounters:  05/03/19 188 lb 6.4 oz (85.5 kg)  10/05/18 180 lb 12.8 oz (82 kg)  05/09/18 168 lb (76.2 kg)   BMI Readings from Last 3 Encounters:  05/03/19 32.34 kg/m  10/05/18 31.03 kg/m  05/09/18 28.84 kg/m     Hep C Screening:today  STD testing and prevention (HIV/chl/gon/syphilis): HIV only  Intimate partner violence: negative screen Sexual History/Pain during Intercourse: not sexually in over one year  Menstrual History/LMP/Abnormal Bleeding: regular cycles, discussed perimenopausal symptoms  Incontinence Symptoms:   Breast cancer:  - Last Mammogram: she is due  - BRCA gene screening: N/A   Osteoporosis prevention : discussed high calcium diet and vitamin D supplementation, also needs to increase physical activity  Cervical cancer screening: up to date   Skin cancer: discussed atypical lesions  Colorectal cancer: she wants cologuard  Lung cancer:   Low Dose CT Chest recommended if Age 11-80 years, 30 pack-year currently smoking OR have quit w/in 15years. Patient does not qualify.   ECG:09/2017   Advanced Care Planning: A voluntary discussion about advance care planning including the explanation and discussion of advance directives.  Discussed health care proxy and Living will, and the patient was able to identify a health care proxy as son .  Patient does not have a living will at present time.   Lipids: Lab Results  Component Value Date   CHOL 160 07/02/2015   CHOL 154  08/03/2012   Lab Results  Component Value Date   HDL 78 07/02/2015   HDL 51 08/03/2012   Lab Results  Component Value Date   LDLCALC 73 07/02/2015   LDLCALC 92 08/03/2012   Lab Results  Component Value Date   TRIG 43 07/02/2015   TRIG 53 08/03/2012   Lab Results  Component Value Date   CHOLHDL 2.1 07/02/2015   No results found for: LDLDIRECT  Glucose: Glucose  Date Value Ref Range Status  07/02/2015 89 65 - 99 mg/dL Final   Glucose, Bld  Date Value Ref Range Status  09/11/2017 87 65 - 99 mg/dL Final    Patient Active Problem List   Diagnosis Date Noted  . Hypertension 09/11/2017  . Adhesive capsulitis of right shoulder 05/12/2017  . Moderate intermittent asthma with acute exacerbation 07/02/2015  . Perennial allergic rhinitis with seasonal variation 07/02/2015  . Hypokalemia 07/02/2015  . History of DVT of lower extremity     Past Surgical History:  Procedure Laterality Date  . INSERTION OF VENA CAVA FILTER  04/2014   Dr. Lucky Cowboy   . IVC FILTER REMOVAL N/A 12/29/2016   Procedure: IVC Filter Removal;  Surgeon: Algernon Huxley, MD;  Location: [place] [facility];  Service: Cardiovascular;  Laterality: N/A;  . PERIPHERAL VASCULAR THROMBECTOMY Left 11/01/2016   Procedure: Peripheral Vascular Thrombectomy;  Surgeon: Algernon Huxley, MD;  Location: [place] CV [facility];  Service: Cardiovascular;  Laterality: Left;  . TUBAL LIGATION      Family History  Problem Relation Age of Onset  . Diabetes Mother   . Hypertension Mother   . Hypercalcemia Father   . Leukemia Son 3  . Diabetes Maternal Grandmother   . Heart disease Paternal Grandfather     Social History   Socioeconomic History  . Marital status: Single    Spouse name: Not on file  . Number of children: 3  . Years of education: Not on file  . Highest education level: Associate degree: occupational, Hotel manager, or vocational program  Occupational History  . Not on file  Social Needs  . Financial resource strain:  Not hard at all  . Food insecurity    Worry: Never true    Inability: Never true  . Transportation needs    Medical: No    Non-medical: No  Tobacco Use  . Smoking status: Former Smoker    Packs/day: 0.50    Years: 10.00    Pack years: 5.00    Types: Cigarettes    Start date: 08/01/1998    Quit date: 08/01/2008    Years since quitting: 10.7  . Smokeless tobacco: Never Used  Substance and Sexual Activity  . Alcohol use: Not Currently    Alcohol/week: 0.0 standard drinks    Comment: many years  ago  . Drug use: Never  . Sexual activity: Not Currently  Lifestyle  . Physical activity    Days per week: 0 days    Minutes per session: 0 min  . Stress: Only a little  Relationships  . Social connections    Talks on phone: More than three times a week    Gets together: Once a week    Attends religious service: More than 4 times per year    Active member of club or organization: Yes    Attends meetings of clubs or organizations: 1 to 4 times per year    Relationship status: Divorced  . Intimate partner violence    Fear of current or ex partner: No    Emotionally abused: No    Physically abused: No    Forced sexual activity: No  Other Topics Concern  . Not on file  Social History Narrative  . Not on file     Current Outpatient Medications:  .  albuterol (VENTOLIN HFA) 108 (90 Base) MCG/ACT inhaler, INHALE 2 PUFFS BY MOUTH EVERY 6 HOURS AS NEEDED FOR WHEEZING OR SHORTNESS OF BREATH, Disp: 8.5 g, Rfl: 0 .  amLODipine (NORVASC) 2.5 MG tablet, Take 1 tablet (2.5 mg total) by mouth daily., Disp: 90 tablet, Rfl: 1 .  fluticasone furoate-vilanterol (BREO ELLIPTA) 100-25 MCG/INH AEPB, Inhale 1 puff into the lungs daily., Disp: 180 each, Rfl: 1 .  loratadine (CLARITIN) 10 MG tablet, Take 1 tablet (10 mg total) by mouth daily., Disp: 30 tablet, Rfl: 5 .  losartan-hydrochlorothiazide (HYZAAR) 50-12.5 MG tablet, Take 1 tablet by mouth daily., Disp: 90 tablet, Rfl: 1 .  montelukast (SINGULAIR)  10 MG tablet, Take 1 tablet (10 mg total) by mouth at bedtime., Disp: 90 tablet, Rfl: 0 .  Multiple Vitamin (MULTIVITAMIN WITH MINERALS) TABS tablet, Take 1 tablet by mouth daily., Disp: , Rfl:  .  XARELTO 20 MG TABS tablet, Take 1 tablet (20 mg total) by mouth daily with supper., Disp: 90 tablet, Rfl: 2 .  acetaminophen (TYLENOL) 500 MG tablet, Take 1,000 mg by mouth 2 (two) times daily as needed for moderate pain., Disp: , Rfl:  .  predniSONE (DELTASONE) 20 MG tablet, Take 1 tablet (20 mg total) by mouth 2 (two) times daily with a meal. (Patient not taking: Reported on 05/03/2019), Disp: 10 tablet, Rfl: 0  No Known Allergies   ROS  Constitutional: Negative for fever, positive for  weight change.  Respiratory: Negative for cough and  shortness of breath.   Cardiovascular: intermittent chest pain but no  palpitations.  Gastrointestinal: Negative for abdominal pain, no bowel changes.  Musculoskeletal: Negative for gait problem or joint swelling.  Skin: Negative for rash.  Neurological: Negative for dizziness or headache.  No other specific complaints in a complete review of systems (except as listed in HPI above).  Objective  Vitals:   05/03/19 1413  BP: 126/74  Pulse: 71  Resp: 16  Temp: 97.7 F (36.5 C)  TempSrc: Temporal  SpO2: 95%  Weight: 188 lb 6.4 oz (85.5 kg)  Height: _0  (1.626 m)    Body mass index is 32.34 kg/m.  Physical Exam  Constitutional: Patient appears well-developed and well-nourished. No distress.  HENT: Head: Normocephalic and atraumatic. Ears: B TMs ok, no erythema or effusion; Nose: Nose normal. Mouth/Throat: not done  Eyes: Conjunctivae and EOM are normal. Pupils are equal, round, and reactive to light. No scleral icterus.  Neck: Normal range of motion. Neck supple. No JVD  present. No thyromegaly present.  Cardiovascular: Normal rate, regular rhythm and normal heart sounds.  No murmur heard. No BLE edema. Pulmonary/Chest: Effort normal and breath  sounds normal. No respiratory distress. Abdominal: Soft. Bowel sounds are normal, no distension. There is no tenderness. no masses Breast: no lumps or masses, no nipple discharge or rashes FEMALE GENITALIA:  Not done  RECTAL: not done Musculoskeletal: Normal range of motion, no joint effusions. No gross deformities Neurological: he is alert and oriented to person, place, and time. No cranial nerve deficit. Coordination, balance, strength, speech and gait are normal.  Skin: Skin is warm and dry. No rash noted. No erythema.  Psychiatric: Patient has a normal mood and affect. behavior is normal. Judgment and thought content normal.  Fall Risk: Fall Risk  05/03/2019 05/09/2018 02/19/2018 09/18/2017 09/11/2017  Falls in the past year? 0 No No No No  Number falls in past yr: 0 - - - -  Injury with Fall? 0 - - - -     Functional Status Survey: Is the patient deaf or have difficulty hearing?: No Does the patient have difficulty seeing, even when wearing glasses/contacts?: No Does the patient have difficulty concentrating, remembering, or making decisions?: No Does the patient have difficulty walking or climbing stairs?: No Does the patient have difficulty dressing or bathing?: No Does the patient have difficulty doing errands alone such as visiting a doctor's office or shopping?: No   Assessment & Plan  1. 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 Needs flu shot  refused  3. Essential hypertension  - amLODipine (NORVASC) 2.5 MG tablet; Take 1 tablet (2.5 mg total) by mouth daily.  Dispense: 90 tablet; Refill: 1 - losartan-hydrochlorothiazide (HYZAAR) 50-12.5 MG tablet; Take 1 tablet by mouth daily.  Dispense: 90 tablet; Refill: 1 - Comprehensive metabolic panel - CBC with Differential/Platelet  4. Asthma, moderate persistent, well-controlled  - fluticasone furoate-vilanterol (BREO ELLIPTA) 100-25 MCG/INH AEPB; Inhale 1 puff  into the lungs daily.  Dispense: 180 each; Refill: 1 - montelukast (SINGULAIR) 10 MG tablet; Take 1 tablet (10 mg total) by mouth at bedtime.  Dispense: 90 tablet; Refill: 1  5. Anticoagulant long-term use   6. Colon cancer screening  - Cologuard  7. Breast cancer screening by mammogram  - MM 3D SCREEN BREAST BILATERAL; Future  8. Screening for diabetes mellitus  - Hemoglobin A1c  9. HIV screening declined  - HIV Antibody (routine testing w rflx)  10. Well adult exam  - B12 and Folate Panel - TSH - VITAMIN D 25 Hydroxy (Vit-D Deficiency, Fractures)  11. Lipid screening  - Lipid panel  12. Need for hepatitis C screening test  - Hepatitis C antibody  13. Cough  - DG Chest 2 View; Future She has more of chest discomfort, explained it may be reflux, versus asthma, to try otc prilosec and use inhaler next time, she will try cutting down on coffee, eat breakfast and let me know how she feels, she asked about something in her chest and we decided to check a CXR  -USPSTF grade A and B recommendations reviewed with patient; age-appropriate recommendations, preventive care, screening tests, etc discussed and encouraged; healthy living encouraged; see AVS for patient education given to patient -Discussed importance of 150 minutes of physical activity weekly, eat two servings of fish weekly, eat one serving of tree nuts ( cashews, pistachios, pecans, almonds.Marland Kitchen) every other day, eat 6 servings of fruit/vegetables daily and drink  plenty of water and avoid sweet beverages.

## 2019-05-03 NOTE — Patient Instructions (Signed)

## 2019-05-04 LAB — COMPREHENSIVE METABOLIC PANEL
ALT: 28 IU/L (ref 0–32)
AST: 23 IU/L (ref 0–40)
Albumin/Globulin Ratio: 1.9 (ref 1.2–2.2)
Albumin: 4.3 g/dL (ref 3.8–4.9)
Alkaline Phosphatase: 52 IU/L (ref 39–117)
BUN/Creatinine Ratio: 17 (ref 9–23)
BUN: 14 mg/dL (ref 6–24)
Bilirubin Total: 0.4 mg/dL (ref 0.0–1.2)
CO2: 27 mmol/L (ref 20–29)
Calcium: 9.7 mg/dL (ref 8.7–10.2)
Chloride: 99 mmol/L (ref 96–106)
Creatinine, Ser: 0.82 mg/dL (ref 0.57–1.00)
GFR calc Af Amer: 96 mL/min/{1.73_m2} (ref >59)
GFR calc non Af Amer: 83 mL/min/{1.73_m2} (ref >59)
Globulin, Total: 2.3 g/dL (ref 1.5–4.5)
Glucose: 88 mg/dL (ref 65–99)
Potassium: 3.3 mmol/L — ABNORMAL LOW (ref 3.5–5.2)
Sodium: 139 mmol/L (ref 134–144)
Total Protein: 6.6 g/dL (ref 6.0–8.5)

## 2019-05-04 LAB — CBC WITH DIFFERENTIAL/PLATELET
Basophils Absolute: 0.1 10*3/uL (ref 0.0–0.2)
Basos: 1 %
EOS (ABSOLUTE): 0.1 10*3/uL (ref 0.0–0.4)
Eos: 2 %
Hematocrit: 35.8 % (ref 34.0–46.6)
Hemoglobin: 12 g/dL (ref 11.1–15.9)
Immature Grans (Abs): 0 10*3/uL (ref 0.0–0.1)
Immature Granulocytes: 0 %
Lymphocytes Absolute: 2.6 10*3/uL (ref 0.7–3.1)
Lymphs: 46 %
MCH: 29.6 pg (ref 26.6–33.0)
MCHC: 33.5 g/dL (ref 31.5–35.7)
MCV: 88 fL (ref 79–97)
Monocytes Absolute: 0.5 10*3/uL (ref 0.1–0.9)
Monocytes: 8 %
Neutrophils Absolute: 2.5 10*3/uL (ref 1.4–7.0)
Neutrophils: 43 %
Platelets: 241 10*3/uL (ref 150–450)
RBC: 4.06 x10E6/uL (ref 3.77–5.28)
RDW: 13 % (ref 11.7–15.4)
WBC: 5.8 10*3/uL (ref 3.4–10.8)

## 2019-05-04 LAB — LIPID PANEL
Chol/HDL Ratio: 2.8 ratio (ref 0.0–4.4)
Cholesterol, Total: 186 mg/dL (ref 100–199)
HDL: 67 mg/dL (ref >39)
LDL Chol Calc (NIH): 109 mg/dL — ABNORMAL HIGH (ref 0–99)
Triglycerides: 52 mg/dL (ref 0–149)
VLDL Cholesterol Cal: 10 mg/dL (ref 5–40)

## 2019-05-04 LAB — B12 AND FOLATE PANEL
Folate: >20 ng/mL (ref >3.0)
Vitamin B-12: 501 pg/mL (ref 232–1245)

## 2019-05-04 LAB — HEPATITIS C ANTIBODY: Hep C Virus Ab: <0.1 s/co ratio (ref 0.0–0.9)

## 2019-05-04 LAB — HEMOGLOBIN A1C
Est. average glucose Bld gHb Est-mCnc: 114 mg/dL
Hgb A1c MFr Bld: 5.6 % (ref 4.8–5.6)

## 2019-05-04 LAB — HIV ANTIBODY (ROUTINE TESTING W REFLEX): HIV Screen 4th Generation wRfx: NONREACTIVE

## 2019-05-04 LAB — VITAMIN D 25 HYDROXY (VIT D DEFICIENCY, FRACTURES): Vit D, 25-Hydroxy: 36 ng/mL (ref 30.0–100.0)

## 2019-05-04 LAB — TSH: TSH: 1.38 u[IU]/mL (ref 0.450–4.500)

## 2019-09-04 ENCOUNTER — Telehealth: Payer: Self-pay

## 2019-09-04 NOTE — Telephone Encounter (Signed)
Copied from CRM [id number]. Topic: Appointment Scheduling - Scheduling Inquiry for Clinic >> Sep 04, 2019 11:10 AM Crist Infante wrote: Reason for CRM: pt has ongoing chest "aching" since her last visit in Oct 2020.  Pt states the chest ache is getting worse to the point she has to take ibuprofen every 4 hrs. Pt thinks she may have PNA or bronchitis.  Would like Dr Carlynn Purl to listen to her chest and perhaps send for further study. >> Sep 04, 2019 12:49 PM William Dalton wrote: [name] [name] please advise

## 2019-09-05 ENCOUNTER — Ambulatory Visit
Admission: RE | Admit: 2019-09-05 | Discharge: 2019-09-05 | Disposition: A | Discharge status: Home / Self Care | Payer: BC Managed Care – PPO | Source: Ambulatory Visit | Attending: Internal Medicine | Admitting: Internal Medicine

## 2019-09-05 ENCOUNTER — Other Ambulatory Visit: Payer: Self-pay

## 2019-09-05 ENCOUNTER — Other Ambulatory Visit: Payer: Self-pay | Admitting: Internal Medicine

## 2019-09-05 ENCOUNTER — Other Ambulatory Visit: Payer: Self-pay | Admitting: Family Medicine

## 2019-09-05 ENCOUNTER — Ambulatory Visit (INDEPENDENT_AMBULATORY_CARE_PROVIDER_SITE_OTHER): Payer: BC Managed Care – PPO | Admitting: Internal Medicine

## 2019-09-05 ENCOUNTER — Encounter: Payer: Self-pay | Admitting: Internal Medicine

## 2019-09-05 ENCOUNTER — Ambulatory Visit: Payer: BC Managed Care – PPO | Admitting: Family Medicine

## 2019-09-05 VITALS — BP 140/90 | HR 82 | Temp 97.3°F | Resp 16 | Ht 64.0 in | Wt 190.2 lb

## 2019-09-05 DIAGNOSIS — J4521 Mild intermittent asthma with (acute) exacerbation: Secondary | ICD-10-CM

## 2019-09-05 DIAGNOSIS — I82502 Chronic embolism and thrombosis of unspecified deep veins of left lower extremity: Secondary | ICD-10-CM

## 2019-09-05 DIAGNOSIS — R0789 Other chest pain: Secondary | ICD-10-CM

## 2019-09-05 DIAGNOSIS — I10 Essential (primary) hypertension: Secondary | ICD-10-CM | POA: Diagnosis not present

## 2019-09-05 DIAGNOSIS — J4520 Mild intermittent asthma, uncomplicated: Secondary | ICD-10-CM

## 2019-09-05 DIAGNOSIS — IMO0001 Reserved for inherently not codable concepts without codable children: Secondary | ICD-10-CM

## 2019-09-05 MED ORDER — ALBUTEROL SULFATE HFA 108 (90 BASE) MCG/ACT IN AERS: INHALATION_SPRAY | RESPIRATORY_TRACT | 1 refills | Status: DC

## 2019-09-05 MED ORDER — OMEPRAZOLE 20 MG PO CPDR: 20.0000 mg | DELAYED_RELEASE_CAPSULE | Freq: Every day | ORAL | 1 refills | Status: DC

## 2019-09-05 MED ORDER — PREDNISONE 10 MG PO TABS: ORAL_TABLET | ORAL | 0 refills | Status: DC

## 2019-09-05 NOTE — Addendum Note (Signed)
Addended by: Welford Roche D on: 09/05/2019 03:13 PM   Modules accepted: Orders

## 2019-09-05 NOTE — Patient Instructions (Signed)
Chest X-ray today ordered.

## 2019-09-05 NOTE — Telephone Encounter (Signed)
Pt wants to see if you can refill this?

## 2019-09-05 NOTE — Telephone Encounter (Signed)
Refill request already responded to by other provider.

## 2019-09-05 NOTE — Progress Notes (Addendum)
Patient ID: [name] [name], female    DOB: Oct 23, 1965, 54 y.o.   MRN: [id number]  PCP: Alba Cory, MD  Chief Complaint  Patient presents with  . Chest Pain    She reports that she has been having chest pain for more than 1 month now. Pain is constant,dull and aches.    Subjective:   [name] [name] is a 54 y.o. female, presents to clinic with CC of the following:  Chief Complaint  Patient presents with  . Chest Pain    She reports that she has been having chest pain for more than 1 month now. Pain is constant,dull and aches.    HPI: Patient presents with CP, she noted has never really gone away since her last visit with Dr. Carlynn Purl when this was mentioned, although has increased over time, especially in the past few weeks. Location - center of chest, behind sternum noted, not more left sided,  Pain is more constant in the past month, to the point that she now needs to take ibuprofen every day, several times a day and that is helpful with her symptoms.  The ibuprofen does not completely get rid of her symptoms. Described as dull ache, more continuous No increase with exertion, relieved with rest component, no increase or relationship with meals noted, no increased with arm movements noted. No N/V, no increased sweatiness, no radiation to jaw or to left arm, no SOB. (Does occasionally feel into her shoulders, both right and left, and sometimes between her shoulder blades in the back) No recent fevers, congestion, PND,  No increase with deep breaths, no wheezing   Dyspnea at rest - Neg Pleuritic component to pain - neg Orthopnea - neg Heart racing  - neg Cough - denies major increased cough Hemoptysis - neg H/o immobilization recent past - denied, not active with exercise and busy with work at [employer] and increased stress related to this some H/o malignancy - denied + h/o chronic DVT, on Xarelto and no leg/calf pains in years, no increased redness or swelling  in legs  No h/o significant cardiac disease, heart attacks + h/o HTN, no recent GERD concerns with heartburn sx's (had a long time ago) no h/oDM, increased lipids (last LDL was 109 in October, HDL - 67, TG's 52), thyroid disease  Activity level - no reg exercise FH - negative for early deaths from HD  Was Covid negative when checked 08/13/19 when presented to UC with diarrhea and fatigue. These sx's resolved.  Last visit with Dr. Carlynn Purl on 10/2, she noted some intermittent chest soreness and CXR then was negative. Below is the description then of the soreness. Chest soreness: she states she has episodes of having the chest feeling sore for about 24 hours , it feels like after a coughing spell but without having a coughing spell. Not burning /not associated with meals. Not associated with SOB, wheezing, chest tightness, diaphoresis or tightness. Pain is constant. She states improves with ibuprofen. It happens a few times a month at most   Also with h/o Asthma: noted doing well on Breo and prn albuterol, no recent flares of asthma. She uses rescue inhaler a few times per month presently and noted about twice a year has an asthmatic bronchitis presentation and this may feel a little like this although not the "rawness" feeling often has with bronchitis.   Also with h/o HTN: bp has been well controlled, with BP's at home the past on checks running a little  higher than usual, 140/90 range noted.  Also with h/o Chronic DVT: she has been on Xareltol for years, seen by Dr. Wyn Quaker and hematologist ,history of IVF placed and removed and alsohad a stent placed because of recurrent DVT. Last episode was 2017 and on anti-coag since.   Also noted some weight gain in past couple years with Dr. Carlynn Purl visit  Former smoker, quit 2010.  Patient Active Problem List   Diagnosis Date Noted  . Chronic deep vein thrombosis (DVT) of left lower extremity (HCC) 05/03/2019  . Hypertension 09/11/2017  . Adhesive  capsulitis of right shoulder 05/12/2017  . Perennial allergic rhinitis with seasonal variation 07/02/2015  . Hypokalemia 07/02/2015  . History of DVT of lower extremity       Current Outpatient Medications:  .  acetaminophen (TYLENOL) 500 MG tablet, Take 1,000 mg by mouth 2 (two) times daily as needed for moderate pain., Disp: , Rfl:  .  albuterol (VENTOLIN HFA) 108 (90 Base) MCG/ACT inhaler, INHALE 2 PUFFS BY MOUTH EVERY 6 HOURS AS NEEDED FOR WHEEZING OR SHORTNESS OF BREATH, Disp: 8.5 g, Rfl: 0 .  amLODipine (NORVASC) 2.5 MG tablet, Take 1 tablet (2.5 mg total) by mouth daily., Disp: 90 tablet, Rfl: 1 .  fluticasone furoate-vilanterol (BREO ELLIPTA) 100-25 MCG/INH AEPB, Inhale 1 puff into the lungs daily., Disp: 180 each, Rfl: 1 .  loratadine (CLARITIN) 10 MG tablet, Take 1 tablet (10 mg total) by mouth daily., Disp: 30 tablet, Rfl: 5 .  losartan-hydrochlorothiazide (HYZAAR) 50-12.5 MG tablet, Take 1 tablet by mouth daily., Disp: 90 tablet, Rfl: 1 .  montelukast (SINGULAIR) 10 MG tablet, Take 1 tablet (10 mg total) by mouth at bedtime., Disp: 90 tablet, Rfl: 1 .  Multiple Vitamin (MULTIVITAMIN WITH MINERALS) TABS tablet, Take 1 tablet by mouth daily., Disp: , Rfl:  .  XARELTO 20 MG TABS tablet, Take 1 tablet (20 mg total) by mouth daily with supper. (Patient not taking: Reported on 09/05/2019), Disp: 90 tablet, Rfl: 2   No Known Allergies   Past Surgical History:  Procedure Laterality Date  . INSERTION OF VENA CAVA FILTER  04/2014   Dr. Wyn Quaker   . IVC FILTER REMOVAL N/A 12/29/2016   Procedure: IVC Filter Removal;  Surgeon: Annice Needy, MD;  Location: [facility] INVASIVE CV LAB;  Service: Cardiovascular;  Laterality: N/A;  . PERIPHERAL VASCULAR THROMBECTOMY Left 11/01/2016   Procedure: Peripheral Vascular Thrombectomy;  Surgeon: Annice Needy, MD;  Location: [facility] INVASIVE CV LAB;  Service: Cardiovascular;  Laterality: Left;  . TUBAL LIGATION       Family History  Problem Relation Age of Onset  .  Diabetes Mother   . Hypertension Mother   . Hypercalcemia Father   . Leukemia Son 3  . Diabetes Maternal Grandmother   . Heart disease Paternal Grandfather      Social History   Socioeconomic History  . Marital status: Single    Spouse name: Not on file  . Number of children: 3  . Years of education: Not on file  . Highest education level: Associate degree: occupational, Scientist, product/process development, or vocational program  Occupational History  . Not on file  Tobacco Use  . Smoking status: Former Smoker    Packs/day: 0.50    Years: 10.00    Pack years: 5.00    Types: Cigarettes    Start date: 08/01/1998    Quit date: 08/01/2008    Years since quitting: 11.1  . Smokeless tobacco: Never Used  Substance and Sexual  Activity  . Alcohol use: Not Currently    Alcohol/week: 0.0 standard drinks    Comment: many years ago  . Drug use: Never  . Sexual activity: Not Currently  Other Topics Concern  . Not on file  Social History Narrative  . Not on file   Social Determinants of Health   Financial Resource Strain:   . Difficulty of Paying Living Expenses: Not on file  Food Insecurity:   . Worried About Charity fundraiser in the Last Year: Not on file  . Ran Out of Food in the Last Year: Not on file  Transportation Needs:   . Lack of Transportation (Medical): Not on file  . Lack of Transportation (Non-Medical): Not on file  Physical Activity: Inactive  . Days of Exercise per Week: 0 days  . Minutes of Exercise per Session: 0 min  Stress: No Stress Concern Present  . Feeling of Stress : Only a little  Social Connections:   . Frequency of Communication with Friends and Family: Not on file  . Frequency of Social Gatherings with Friends and Family: Not on file  . Attends Religious Services: Not on file  . Active Member of Clubs or Organizations: Not on file  . Attends Archivist Meetings: Not on file  . Marital Status: Not on file  Intimate Partner Violence:   . Fear of Current or  Ex-Partner: Not on file  . Emotionally Abused: Not on file  . Physically Abused: Not on file  . Sexually Abused: Not on file    With staff assistance, above reviewed with the patient today.  ROS: As per HPI, no change in bowel habits, no BRBPR or dark stools, no dysuria or flank pains, otherwise no specific complaints on a limited and focused system review   No results found for this or any previous visit (from the past 72 hour(s)).   PHQ2/9: Depression screen [facility] 2/9 09/05/2019 05/03/2019 10/05/2018 05/09/2018 02/19/2018  Decreased Interest 0 0 0 0 0  Down, Depressed, Hopeless 0 0 0 0 0  PHQ - 2 Score 0 0 0 0 0  Altered sleeping 0 0 0 - 0  Tired, decreased energy 0 0 0 - 0  Change in appetite 0 0 0 - 0  Feeling bad or failure about yourself  0 0 0 - 0  Trouble concentrating 0 0 0 - 0  Moving slowly or fidgety/restless 0 0 0 - 0  Suicidal thoughts 0 0 0 - 0  PHQ-9 Score 0 0 0 - 0  Difficult doing work/chores - Not difficult at all - - -   PHQ-2/9 Result is neg  Fall Risk: Fall Risk  09/05/2019 05/03/2019 05/09/2018 02/19/2018 09/18/2017  Falls in the past year? 0 0 No No No  Number falls in past yr: 0 0 - - -  Injury with Fall? 0 0 - - -      Objective:   Vitals:   09/05/19 1228  BP: 140/90  Pulse: 82  Resp: 16  Temp: (!) 97.3 F (36.3 C)  TempSrc: Temporal  SpO2: 98%  Weight: 190 lb 3.2 oz (86.3 kg)  Height: 5\' 4"  (1.626 m)    Body mass index is 32.65 kg/m.  Physical Exam   NAD, masked, looks well and comfortable at rest, pleasant HEENT - [place]/AT, sclera anicteric, conj - non-inj'ed,  pharynx clear Neck - supple, no adenopathy, no TM, carotids 2+ and = without bruits bilat, no JVD Car - RRR without m/g/r,  not tachy Pulm- RR and effort normal at rest, CTA without wheeze or rales Unable to reproduce pain with pressure over anterior chest nor with arm/shoulder motions Abd - soft, NT, ND, no obvious HSM,  no masses Skin- no rash noted,  Ext - no LE edema, no active  joints, NT in left calf and no increased swelling, erythema, no cords, pulses good distal with good DP pulse,  Neuro/psychiatric - affect was not flat, appropriate with conversation  Alert and oriented  Grossly non-focal   Speech and gait are normal   ECG - NSR, HR - 64, no acute changes, question possible LAE, no old available to compare  Results for orders placed or performed in visit on 05/03/19  CBC with Differential/Platelet  Result Value Ref Range   WBC 5.8 3.4 - 10.8 x10E3/uL   RBC 4.06 3.77 - 5.28 x10E6/uL   Hemoglobin 12.0 11.1 - 15.9 g/dL   Hematocrit 81.0 17.5 - 46.6 %   MCV 88 79 - 97 fL   MCH 29.6 26.6 - 33.0 pg   MCHC 33.5 31.5 - 35.7 g/dL   RDW 10.2 58.5 - 27.7 %   Platelets 241 150 - 450 x10E3/uL   Neutrophils 43 Not Estab. %   Lymphs 46 Not Estab. %   Monocytes 8 Not Estab. %   Eos 2 Not Estab. %   Basos 1 Not Estab. %   Neutrophils Absolute 2.5 1.4 - 7.0 x10E3/uL   Lymphocytes Absolute 2.6 0.7 - 3.1 x10E3/uL   Monocytes Absolute 0.5 0.1 - 0.9 x10E3/uL   EOS (ABSOLUTE) 0.1 0.0 - 0.4 x10E3/uL   Basophils Absolute 0.1 0.0 - 0.2 x10E3/uL   Immature Granulocytes 0 Not Estab. %   Immature Grans (Abs) 0.0 0.0 - 0.1 x10E3/uL  Comprehensive metabolic panel  Result Value Ref Range   Glucose 88 65 - 99 mg/dL   BUN 14 6 - 24 mg/dL   Creatinine, Ser 8.24 0.57 - 1.00 mg/dL   GFR calc non Af Amer 83 >59 mL/min/1.73   GFR calc Af Amer 96 >59 mL/min/1.73   BUN/Creatinine Ratio 17 9 - 23   Sodium 139 134 - 144 mmol/L   Potassium 3.3 (L) 3.5 - 5.2 mmol/L   Chloride 99 96 - 106 mmol/L   CO2 27 20 - 29 mmol/L   Calcium 9.7 8.7 - 10.2 mg/dL   Total Protein 6.6 6.0 - 8.5 g/dL   Albumin 4.3 3.8 - 4.9 g/dL   Globulin, Total 2.3 1.5 - 4.5 g/dL   Albumin/Globulin Ratio 1.9 1.2 - 2.2   Bilirubin Total 0.4 0.0 - 1.2 mg/dL   Alkaline Phosphatase 52 39 - 117 IU/L   AST 23 0 - 40 IU/L   ALT 28 0 - 32 IU/L  Lipid panel  Result Value Ref Range   Cholesterol, Total 186 100 - 199  mg/dL   Triglycerides 52 0 - 149 mg/dL   HDL 67 >23 mg/dL   VLDL Cholesterol Cal 10 5 - 40 mg/dL   LDL Chol Calc (NIH) 536 (H) 0 - 99 mg/dL   Chol/HDL Ratio 2.8 0.0 - 4.4 ratio  B12 and Folate Panel  Result Value Ref Range   Vitamin B-12 501 232 - 1,245 pg/mL   Folate >20.0 >3.0 ng/mL  Hemoglobin A1c  Result Value Ref Range   Hgb A1c MFr Bld 5.6 4.8 - 5.6 %   Est. average glucose Bld gHb Est-mCnc 114 mg/dL  TSH  Result Value Ref Range   TSH  1.380 0.450 - 4.500 uIU/mL  VITAMIN D 25 Hydroxy (Vit-D Deficiency, Fractures)  Result Value Ref Range   Vit D, 25-Hydroxy 36.0 30.0 - 100.0 ng/mL  HIV Antibody (routine testing w rflx)  Result Value Ref Range   HIV Screen 4th Generation wRfx Non Reactive Non Reactive  Hepatitis C antibody  Result Value Ref Range   Hep C Virus Ab <0.1 0.0 - 0.9 s/co ratio       Assessment & Plan:   1. Other chest pain  Exact cause unclear.  Discussed some possibilities today, with unlikely an acute ischemic cardiac source.  Some concerns with her DVT history for possibility of PE, although that seems very unlikely clinically, and the Wells criteria also support much less likely (only real positive is the h/o DVT noted).  No marked recent infectious symptoms, and asthmatic bronchitis type flare seems less likely. She did note it almost feels like that to some extent.  Chest wall pain possible, with a costochondritis type source possible, although it was not reproducible today on exam.  GERD a possibility, although without classic burning component and no relationship to meals.  ECG - no acute changes noted DG Chest 2 View ordered as she felt that was good to do Discussed possible labs including the pros and cons of a d-dimer and will hold on doing so today, but may add pending response.   If CXR ok as expected, will add a prednisone course, as with ibuprofen helping and the chronic dull achy description, likely an inflammatory source and will assess response to  weaning dose of prednisone ordered. Try to lessen ibuprofen if prednisone helping Do feel adding omeprazole - 20 mg also is helpful, especially noting the chronic ibuprofen use recent past   2. Hypertension, unspecified type Cont meds,  BP's may be up some recent past due to pain increase and continue to monitor  3. Chronic deep vein thrombosis (DVT) of left lower extremity, unspecified vein (HCC) Cont the Xarelto presently  4. Moderate intermittent asthma without complication Will get a CXR Cont Breo inhaler, Singulair, and the alb prn   F/u in 10-14 days, sooner if worsening as discussed at length today Also, if any increasing CP's more acutely, any associated sx's of concern reviewed, more SOB or any passing out feelings, f/u to a more emergent setting rec'ed and she understood Discussed some possible next steps pending her response and she inquired if an MRI or CT may be needed and better tests than a repeat CXR to help assess. May need to pursue if not improving, and emphasized the importance to f/u sooner if worsening, sx's changing as discussed today.   After patient left, she asked if I could refill her albuterol inhaler and did.   Jamelle Haring, MD 09/05/19 12:33 PM

## 2019-09-05 NOTE — Telephone Encounter (Signed)
Ms. Maple Hudson isn't a patient at the [facility].  Epic indicates Dr. Alba Cory is her PCP.  AMD

## 2019-09-05 NOTE — Telephone Encounter (Signed)
Pt is scheduled to see Dr. Carlynn Purl at 12 noon today.

## 2019-09-06 NOTE — Progress Notes (Signed)
Good news. The chest x-ray was read as no active disease (see attached). I also refilled your albuterol inhaler yesterday as requested.  Dr. Dorris Fetch

## 2019-09-10 ENCOUNTER — Encounter: Payer: Self-pay | Admitting: Family Medicine

## 2019-09-18 ENCOUNTER — Encounter: Payer: Self-pay | Admitting: Family Medicine

## 2019-09-18 ENCOUNTER — Other Ambulatory Visit: Payer: Self-pay | Admitting: Family Medicine

## 2019-09-18 ENCOUNTER — Ambulatory Visit (INDEPENDENT_AMBULATORY_CARE_PROVIDER_SITE_OTHER): Payer: BC Managed Care – PPO | Admitting: Family Medicine

## 2019-09-18 VITALS — Temp 97.8°F | Ht 65.0 in | Wt 179.0 lb

## 2019-09-18 DIAGNOSIS — R0789 Other chest pain: Secondary | ICD-10-CM

## 2019-09-18 DIAGNOSIS — J4520 Mild intermittent asthma, uncomplicated: Secondary | ICD-10-CM

## 2019-09-18 DIAGNOSIS — IMO0001 Reserved for inherently not codable concepts without codable children: Secondary | ICD-10-CM

## 2019-09-18 MED ORDER — ALBUTEROL SULFATE HFA 108 (90 BASE) MCG/ACT IN AERS: INHALATION_SPRAY | RESPIRATORY_TRACT | 1 refills | Status: DC

## 2019-09-18 MED ORDER — PREDNISONE 10 MG PO TABS: 10.0000 mg | ORAL_TABLET | Freq: Every day | ORAL | 0 refills | Status: DC

## 2019-09-18 NOTE — Progress Notes (Signed)
Name: [name] [name]   MRN: [id number]    DOB: 22-Jun-1966   Date:09/18/2019       Progress Note  Subjective  Chief Complaint  Chief Complaint  Patient presents with  . Follow-up    chest pain has lessened, chest pain only when she lays down    I connected with  Jessey Marina Goodell on 09/18/19 at 11:00 AM EST by telephone and verified that I am speaking with the correct person using two identifiers.  I discussed the limitations, risks, security and privacy concerns of performing an evaluation and management service by telephone and the availability of in person appointments. Staff also discussed with the patient that there may be a patient responsible charge related to this service. Patient Location: at home Provider Location: [facility]  HPI  Chest pain: seen by Dr. Roxan Hockey 09/05/2019, because substernal chest pain was getting progressively worse and constant. She was taking a lot of ibuprofen. She had EKG, CXR and were normal, she was given PPI and prednisone, she is feeling better, pain is no longer constant, but seems worse when laying down, usually goes away when she takes ibuprofen or melatonin and falls back asleep. Mostly bothering her at night, very seldom during the day . Advised to stop ibuprofen , try tylenol instead and continue omeprazole   Patient Active Problem List   Diagnosis Date Noted  . Chronic deep vein thrombosis (DVT) of left lower extremity ([place]) 05/03/2019  . Hypertension 09/11/2017  . Adhesive capsulitis of right shoulder 05/12/2017  . Perennial allergic rhinitis with seasonal variation 07/02/2015  . Hypokalemia 07/02/2015  . History of DVT of lower extremity     Past Surgical History:  Procedure Laterality Date  . INSERTION OF VENA CAVA FILTER  04/2014   Dr. Lucky Cowboy   . IVC FILTER REMOVAL N/A 12/29/2016   Procedure: IVC Filter Removal;  Surgeon: Algernon Huxley, MD;  Location: [place] CV LAB;  Service: Cardiovascular;   Laterality: N/A;  . PERIPHERAL VASCULAR THROMBECTOMY Left 11/01/2016   Procedure: Peripheral Vascular Thrombectomy;  Surgeon: Algernon Huxley, MD;  Location: [place] CV LAB;  Service: Cardiovascular;  Laterality: Left;  . TUBAL LIGATION      Family History  Problem Relation Age of Onset  . Diabetes Mother   . Hypertension Mother   . Hypercalcemia Father   . Leukemia Son 3  . Diabetes Maternal Grandmother   . Heart disease Paternal Grandfather     .   Tobacco Use: Medium Risk  . Smoking Tobacco Use: Former Smoker  . Smokeless Tobacco Use: Never Used   Social History   Substance and Sexual Activity  Alcohol Use Not Currently  . Alcohol/week: 0.0 standard drinks   Comment: many years ago    Current Outpatient Medications:  .  acetaminophen (TYLENOL) 500 MG tablet, Take 1,000 mg by mouth 2 (two) times daily as needed for moderate pain., Disp: , Rfl:  .  albuterol (VENTOLIN HFA) 108 (90 Base) MCG/ACT inhaler, INHALE 2 PUFFS BY MOUTH EVERY 6 HOURS AS NEEDED FOR WHEEZING OR SHORTNESS OF BREATH, Disp: 8.5 g, Rfl: 1 .  amLODipine (NORVASC) 2.5 MG tablet, Take 1 tablet (2.5 mg total) by mouth daily., Disp: 90 tablet, Rfl: 1 .  fluticasone furoate-vilanterol (BREO ELLIPTA) 100-25 MCG/INH AEPB, Inhale 1 puff into the lungs daily., Disp: 180 each, Rfl: 1 .  loratadine (CLARITIN) 10 MG tablet, Take 1 tablet (10 mg total) by mouth daily., Disp: 30 tablet, Rfl: 5 .  losartan-hydrochlorothiazide (HYZAAR) 50-12.5 MG tablet, Take 1 tablet by mouth daily., Disp: 90 tablet, Rfl: 1 .  montelukast (SINGULAIR) 10 MG tablet, Take 1 tablet (10 mg total) by mouth at bedtime., Disp: 90 tablet, Rfl: 1 .  Multiple Vitamin (MULTIVITAMIN WITH MINERALS) TABS tablet, Take 1 tablet by mouth daily., Disp: , Rfl:  .  omeprazole (PRILOSEC) 20 MG capsule, TAKE 1 CAPSULE(20 MG) BY MOUTH DAILY, Disp: 90 capsule, Rfl: 0 .  XARELTO 20 MG TABS tablet, Take 1 tablet (20 mg total) by mouth daily with supper. (Patient not  taking: Reported on 09/05/2019), Disp: 90 tablet, Rfl: 2  No Known Allergies  I personally reviewed active problem list, medication list, allergies, family history, social history with the patient/caregiver today.   ROS  Ten systems reviewed and is negative except as mentioned in HPI  Objective  Virtual encounter, vitals not obtained.  Body mass index is 29.79 kg/m.  Physical Exam  Awake, alert and oriented   PHQ2/9: Depression screen [facility] 2/9 09/18/2019 09/05/2019 05/03/2019 10/05/2018 05/09/2018  Decreased Interest 0 0 0 0 0  Down, Depressed, Hopeless 0 0 0 0 0  PHQ - 2 Score 0 0 0 0 0  Altered sleeping 0 0 0 0 -  Tired, decreased energy 1 0 0 0 -  Change in appetite 0 0 0 0 -  Feeling bad or failure about yourself  0 0 0 0 -  Trouble concentrating 0 0 0 0 -  Moving slowly or fidgety/restless 0 0 0 0 -  Suicidal thoughts 0 0 0 0 -  PHQ-9 Score 1 0 0 0 -  Difficult doing work/chores Not difficult at all - Not difficult at all - -   PHQ-2/9 Result is negative.    Fall Risk: Fall Risk  09/18/2019 09/05/2019 05/03/2019 05/09/2018 02/19/2018  Falls in the past year? 0 0 0 No No  Number falls in past yr: 0 0 0 - -  Injury with Fall? 0 0 0 - -    Assessment & Plan  1. Costochondral chest pain  - predniSONE (DELTASONE) 10 MG tablet; Take 1 tablet (10 mg total) by mouth daily with breakfast.  Dispense: 48 tablet; Refill: 0  2. Moderate intermittent asthma   - albuterol (VENTOLIN HFA) 108 (90 Base) MCG/ACT inhaler; INHALE 2 PUFFS BY MOUTH EVERY 6 HOURS AS NEEDED FOR WHEEZING OR SHORTNESS OF BREATH  Dispense: 8.5 g; Refill: 1  I discussed the assessment and treatment plan with the patient. The patient was provided an opportunity to ask questions and all were answered. The patient agreed with the plan and demonstrated an understanding of the instructions.   The patient was advised to call back or seek an in-person evaluation if the symptoms worsen or if the condition fails to improve  as anticipated.  I provided 15  minutes of non-face-to-face time during this encounter.  Ruel Favors, MD

## 2019-09-18 NOTE — Telephone Encounter (Signed)
Requested Prescriptions  Pending Prescriptions Disp Refills  . albuterol (VENTOLIN HFA) 108 (90 Base) MCG/ACT inhaler [Pharmacy Med Name: ALBUTEROL HFA INH (200 PUFFS)8.5GM] 25.5 g     Sig: INHALE 2 PUFFS BY MOUTH EVERY 6 HOURS AS NEEDED FOR WHEEZING OR SHORTNESS OF BREATH     Pulmonology:  Beta Agonists Failed - 09/18/2019 12:11 PM      Failed - One inhaler should last at least one month. If the patient is requesting refills earlier, contact the patient to check for uncontrolled symptoms.      Passed - Valid encounter within last 12 months    Recent Outpatient Visits          Today Costochondral chest pain   [facility] Alba Cory, MD   1 week ago Other chest pain   [facility] Welford Roche D, MD   4 months ago Chronic deep vein thrombosis (DVT) of left lower extremity, unspecified vein [facility])   [facility] Alba Cory, MD   11 months ago Chronic deep vein thrombosis (DVT) of left lower extremity, unspecified vein [facility])   [facility] Alba Cory, MD   1 year ago Moderate intermittent asthma with acute exacerbation   [facility] Alba Cory, MD      Future Appointments            In 1 month Alba Cory, MD [facility]

## 2019-11-05 ENCOUNTER — Ambulatory Visit: Payer: BC Managed Care – PPO | Admitting: Family Medicine

## 2019-12-03 ENCOUNTER — Other Ambulatory Visit: Payer: Self-pay | Admitting: Family Medicine

## 2019-12-03 DIAGNOSIS — I10 Essential (primary) hypertension: Secondary | ICD-10-CM

## 2020-03-01 ENCOUNTER — Other Ambulatory Visit: Payer: Self-pay | Admitting: Family Medicine

## 2020-03-01 DIAGNOSIS — I10 Essential (primary) hypertension: Secondary | ICD-10-CM

## 2020-03-01 NOTE — Telephone Encounter (Signed)
Requested medication (s) are due for refill today: yes  Requested medication (s) are on the active medication list: yes  Last refill:  12/03/19  Future visit scheduled: no  Notes to clinic:  overdue labs   Requested Prescriptions  Pending Prescriptions Disp Refills   losartan-hydrochlorothiazide (HYZAAR) 50-12.5 MG tablet [Pharmacy Med Name: LOSARTAN/HCTZ 50/12.5MG  TABLETS] 90 tablet 0    Sig: TAKE 1 TABLET BY MOUTH DAILY      Cardiovascular: ARB + Diuretic Combos Failed - 03/01/2020 12:00 PM      Failed - K in normal range and within 180 days    Potassium  Date Value Ref Range Status  05/03/2019 3.3 (L) 3.5 - 5.2 mmol/L Final          Failed - Na in normal range and within 180 days    Sodium  Date Value Ref Range Status  05/03/2019 139 134 - 144 mmol/L Final          Failed - Cr in normal range and within 180 days    Creatinine  Date Value Ref Range Status  07/09/2014 0.84 0.60 - 1.30 mg/dL Final   Creatinine, Ser  Date Value Ref Range Status  05/03/2019 0.82 0.57 - 1.00 mg/dL Final          Failed - Ca in normal range and within 180 days    Calcium  Date Value Ref Range Status  05/03/2019 9.7 8.7 - 10.2 mg/dL Final          Failed - Last BP in normal range    BP Readings from Last 1 Encounters:  09/05/19 140/90          Passed - Patient is not pregnant      Passed - Valid encounter within last 6 months    Recent Outpatient Visits           5 months ago Costochondral chest pain   [facility] Alba Cory, MD   5 months ago Other chest pain   [facility] Welford Roche D, MD   10 months ago Chronic deep vein thrombosis (DVT) of left lower extremity, unspecified vein [facility])   [facility] Alba Cory, MD   1 year ago Chronic deep vein thrombosis (DVT) of left lower extremity, unspecified vein [facility])   [facility] Alba Cory, MD   1 year ago Moderate  intermittent asthma with acute exacerbation   [facility] Alba Cory, MD

## 2020-03-03 ENCOUNTER — Other Ambulatory Visit: Payer: Self-pay

## 2020-03-03 DIAGNOSIS — I10 Essential (primary) hypertension: Secondary | ICD-10-CM

## 2020-03-03 NOTE — Telephone Encounter (Signed)
Lvm for scheduling 

## 2020-04-02 NOTE — Progress Notes (Signed)
Name: [name] [name]   MRN: [id number]    DOB: 05-18-1966   Date:04/03/2020       Progress Note  Subjective  Chief Complaint  Follow up/Medication refill  HPI  Asthma: she is doing well Breo and prn albuterol, no recent flares of asthma, denies cough, wheezing or SOB. She uses rescue inhaler a few times per month   HTN: shestates that she has been taking medications, bp has been well controlled, no dizziness, it went down 116/76 once. No decrease in exercise tolerance   Chronic DVT: she was advised to stay on Xarelto for life, but stopped taking medication on her own at the end of 2020 .  She was seen by Dr. Wyn Quaker and hematologist. She has a history of IVF placed and removed and alsohad a stent placed because of recurrent DVT. Last episode was 2017, took 3 years of Xarelto, stopped on her own but has been taking aspirin 81 mg daily since . Denies easy bruising or blood in stools.No leg pain or swelling   GERD: she was chest pain since last year, got worse, was treated Omeprazole , she took about one week of the medication and symptoms have been controlled since, denies heartburn or indigestion  Obesity: she has gained almost 20 lbs since 2018. She is not eating differently and not sure why she is gaining weight. Likely because she has turned 51, discussed importance of healthy diet and physical activity  Patient Active Problem List   Diagnosis Date Noted  . Chronic deep vein thrombosis (DVT) of left lower extremity (HCC) 05/03/2019  . Hypertension 09/11/2017  . Adhesive capsulitis of right shoulder 05/12/2017  . Perennial allergic rhinitis with seasonal variation 07/02/2015  . Hypokalemia 07/02/2015  . History of DVT of lower extremity     Past Surgical History:  Procedure Laterality Date  . INSERTION OF VENA CAVA FILTER  04/2014   Dr. Wyn Quaker   . IVC FILTER REMOVAL N/A 12/29/2016   Procedure: IVC Filter Removal;  Surgeon: Annice Needy, MD;  Location: [facility] INVASIVE CV LAB;   Service: Cardiovascular;  Laterality: N/A;  . PERIPHERAL VASCULAR THROMBECTOMY Left 11/01/2016   Procedure: Peripheral Vascular Thrombectomy;  Surgeon: Annice Needy, MD;  Location: [facility] INVASIVE CV LAB;  Service: Cardiovascular;  Laterality: Left;  . TUBAL LIGATION      Family History  Problem Relation Age of Onset  . Diabetes Mother   . Hypertension Mother   . Hypercalcemia Father   . Leukemia Son 3  . Diabetes Maternal Grandmother   . Heart disease Paternal Grandfather     Social History   Tobacco Use  . Smoking status: Former Smoker    Packs/day: 0.50    Years: 10.00    Pack years: 5.00    Types: Cigarettes    Start date: 08/01/1998    Quit date: 08/01/2008    Years since quitting: 11.6  . Smokeless tobacco: Never Used  Substance Use Topics  . Alcohol use: Not Currently    Alcohol/week: 0.0 standard drinks    Comment: many years ago     Current Outpatient Medications:  .  acetaminophen (TYLENOL) 500 MG tablet, Take 1,000 mg by mouth 2 (two) times daily as needed for moderate pain., Disp: , Rfl:  .  albuterol (VENTOLIN HFA) 108 (90 Base) MCG/ACT inhaler, INHALE 2 PUFFS BY MOUTH EVERY 6 HOURS AS NEEDED FOR WHEEZING OR SHORTNESS OF BREATH, Disp: 25.5 g, Rfl: 0 .  amLODipine (NORVASC) 2.5 MG tablet, Take  1 tablet (2.5 mg total) by mouth daily., Disp: 90 tablet, Rfl: 1 .  fluticasone furoate-vilanterol (BREO ELLIPTA) 100-25 MCG/INH AEPB, Inhale 1 puff into the lungs daily., Disp: 180 each, Rfl: 1 .  loratadine (CLARITIN) 10 MG tablet, Take 1 tablet (10 mg total) by mouth daily., Disp: 30 tablet, Rfl: 5 .  losartan-hydrochlorothiazide (HYZAAR) 50-12.5 MG tablet, TAKE 1 TABLET BY MOUTH DAILY, Disp: 30 tablet, Rfl: 0 .  montelukast (SINGULAIR) 10 MG tablet, Take 1 tablet (10 mg total) by mouth at bedtime., Disp: 90 tablet, Rfl: 1 .  Multiple Vitamin (MULTIVITAMIN WITH MINERALS) TABS tablet, Take 1 tablet by mouth daily., Disp: , Rfl:  .  omeprazole (PRILOSEC) 20 MG capsule, TAKE 1  CAPSULE(20 MG) BY MOUTH DAILY (Patient not taking: Reported on 04/03/2020), Disp: 90 capsule, Rfl: 0 .  predniSONE (DELTASONE) 10 MG tablet, Take 1 tablet (10 mg total) by mouth daily with breakfast. (Patient not taking: Reported on 04/03/2020), Disp: 48 tablet, Rfl: 0 .  XARELTO 20 MG TABS tablet, Take 1 tablet (20 mg total) by mouth daily with supper. (Patient not taking: Reported on 09/05/2019), Disp: 90 tablet, Rfl: 2  No Known Allergies  I personally reviewed active problem list, medication list, allergies, family history, social history, health maintenance with the patient/caregiver today.   ROS  Constitutional: Negative for fever or weight change.  Respiratory: Negative for cough and shortness of breath.   Cardiovascular: Negative for chest pain or palpitations.  Gastrointestinal: Negative for abdominal pain, no bowel changes.  Musculoskeletal: Negative for gait problem or joint swelling.  Skin: Negative for rash.  Neurological: Negative for dizziness or headache.  No other specific complaints in a complete review of systems (except as listed in HPI above).  Objective  Vitals:   04/03/20 1055  BP: 138/88  Pulse: 82  Resp: 14  Temp: 98.3 F (36.8 C)  TempSrc: Oral  SpO2: 100%  Weight: 188 lb 4.8 oz (85.4 kg)  Height: 5\' 5"  (1.651 m)    Body mass index is 31.33 kg/m.  Physical Exam  Constitutional: Patient appears well-developed and well-nourished. Obese  No distress.  HEENT: head atraumatic, normocephalic, pupils equal and reactive to light,  neck supple Cardiovascular: Normal rate, regular rhythm and normal heart sounds.  No murmur heard. No BLE edema. Pulmonary/Chest: Effort normal and breath sounds normal. No respiratory distress. Abdominal: Soft.  There is no tenderness. Psychiatric: Patient has a normal mood and affect. behavior is normal. Judgment and thought content normal.  PHQ2/9: Depression screen [facility] 2/9 04/03/2020 09/18/2019 09/05/2019 05/03/2019 10/05/2018   Decreased Interest 0 0 0 0 0  Down, Depressed, Hopeless 0 0 0 0 0  PHQ - 2 Score 0 0 0 0 0  Altered sleeping - 0 0 0 0  Tired, decreased energy - 1 0 0 0  Change in appetite - 0 0 0 0  Feeling bad or failure about yourself  - 0 0 0 0  Trouble concentrating - 0 0 0 0  Moving slowly or fidgety/restless - 0 0 0 0  Suicidal thoughts - 0 0 0 0  PHQ-9 Score - 1 0 0 0  Difficult doing work/chores - Not difficult at all - Not difficult at all -    phq 9 is negative   Fall Risk: Fall Risk  04/03/2020 09/18/2019 09/05/2019 05/03/2019 05/09/2018  Falls in the past year? 0 0 0 0 No  Number falls in past yr: 0 0 0 0 -  Injury with Fall? 0  0 0 0 -     Functional Status Survey: Is the patient deaf or have difficulty hearing?: No Does the patient have difficulty seeing, even when wearing glasses/contacts?: No Does the patient have difficulty concentrating, remembering, or making decisions?: No Does the patient have difficulty walking or climbing stairs?: No Does the patient have difficulty dressing or bathing?: No Does the patient have difficulty doing errands alone such as visiting a doctor's office or shopping?: No    Assessment & Plan  1. Moderate intermittent asthma without complication  - albuterol (VENTOLIN HFA) 108 (90 Base) MCG/ACT inhaler; Inhale 2 puffs into the lungs every 4 (four) hours as needed for wheezing or shortness of breath.  Dispense: 18 g; Refill: 1  2. Essential hypertension  - amLODipine (NORVASC) 2.5 MG tablet; Take 1 tablet (2.5 mg total) by mouth daily.  Dispense: 90 tablet; Refill: 1 - losartan-hydrochlorothiazide (HYZAAR) 50-12.5 MG tablet; Take 1 tablet by mouth daily.  Dispense: 90 tablet; Refill: 1 - Comprehensive metabolic panel - CBC with Differential/Platelet - TSH  3. Asthma, moderate persistent, well-controlled  - fluticasone furoate-vilanterol (BREO ELLIPTA) 100-25 MCG/INH AEPB; Inhale 1 puff into the lungs daily.  Dispense: 180 each; Refill: 1 -  montelukast (SINGULAIR) 10 MG tablet; Take 1 tablet (10 mg total) by mouth at bedtime.  Dispense: 90 tablet; Refill: 1  4. Perennial allergic rhinitis with seasonal variation   5. Chronic deep vein thrombosis (DVT) of left lower extremity, unspecified vein (HCC)  She stopped Xarelto on her own   6. GERD without esophagitis  Doing well at this time   7. Colon cancer screening  - Fecal occult blood, imunochemical  8. Screening for diabetes mellitus  A1C  9. Lipid screening  - Lipid panel  10. Other fatigue  - Comprehensive metabolic panel - CBC with Differential/Platelet - B12 and Folate Panel - VITAMIN D 25 Hydroxy (Vit-D Deficiency, Fractures) - TSH

## 2020-04-03 ENCOUNTER — Ambulatory Visit (INDEPENDENT_AMBULATORY_CARE_PROVIDER_SITE_OTHER): Payer: BC Managed Care – PPO | Admitting: Family Medicine

## 2020-04-03 ENCOUNTER — Encounter: Payer: Self-pay | Admitting: Family Medicine

## 2020-04-03 ENCOUNTER — Other Ambulatory Visit: Payer: Self-pay

## 2020-04-03 VITALS — BP 138/88 | HR 82 | Temp 98.3°F | Resp 14 | Ht 65.0 in | Wt 188.3 lb

## 2020-04-03 DIAGNOSIS — J4540 Moderate persistent asthma, uncomplicated: Secondary | ICD-10-CM | POA: Diagnosis not present

## 2020-04-03 DIAGNOSIS — Z131 Encounter for screening for diabetes mellitus: Secondary | ICD-10-CM

## 2020-04-03 DIAGNOSIS — J3089 Other allergic rhinitis: Secondary | ICD-10-CM

## 2020-04-03 DIAGNOSIS — I82502 Chronic embolism and thrombosis of unspecified deep veins of left lower extremity: Secondary | ICD-10-CM | POA: Diagnosis not present

## 2020-04-03 DIAGNOSIS — J302 Other seasonal allergic rhinitis: Secondary | ICD-10-CM

## 2020-04-03 DIAGNOSIS — J4520 Mild intermittent asthma, uncomplicated: Secondary | ICD-10-CM | POA: Diagnosis not present

## 2020-04-03 DIAGNOSIS — K219 Gastro-esophageal reflux disease without esophagitis: Secondary | ICD-10-CM

## 2020-04-03 DIAGNOSIS — I10 Essential (primary) hypertension: Secondary | ICD-10-CM

## 2020-04-03 DIAGNOSIS — Z1211 Encounter for screening for malignant neoplasm of colon: Secondary | ICD-10-CM

## 2020-04-03 DIAGNOSIS — R5383 Other fatigue: Secondary | ICD-10-CM

## 2020-04-03 DIAGNOSIS — IMO0001 Reserved for inherently not codable concepts without codable children: Secondary | ICD-10-CM

## 2020-04-03 DIAGNOSIS — Z13220 Encounter for screening for lipoid disorders: Secondary | ICD-10-CM

## 2020-04-03 MED ORDER — BREO ELLIPTA 100-25 MCG/INH IN AEPB: 1.0000 | INHALATION_SPRAY | Freq: Every day | RESPIRATORY_TRACT | 1 refills | Status: DC

## 2020-04-03 MED ORDER — MONTELUKAST SODIUM 10 MG PO TABS: 10.0000 mg | ORAL_TABLET | Freq: Every day | ORAL | 1 refills | Status: DC

## 2020-04-03 MED ORDER — ALBUTEROL SULFATE HFA 108 (90 BASE) MCG/ACT IN AERS: 2.0000 | INHALATION_SPRAY | RESPIRATORY_TRACT | 1 refills | Status: DC | PRN

## 2020-04-03 MED ORDER — AMLODIPINE BESYLATE 2.5 MG PO TABS: 2.5000 mg | ORAL_TABLET | Freq: Every day | ORAL | 1 refills | Status: DC

## 2020-04-03 MED ORDER — LOSARTAN POTASSIUM-HCTZ 50-12.5 MG PO TABS: 1.0000 | ORAL_TABLET | Freq: Every day | ORAL | 1 refills | Status: DC

## 2020-06-22 ENCOUNTER — Encounter: Payer: Self-pay | Admitting: Family Medicine

## 2020-06-22 ENCOUNTER — Encounter: Payer: BC Managed Care – PPO | Admitting: Family Medicine

## 2020-06-22 NOTE — Progress Notes (Deleted)
Name: [name] [name]   MRN: [id number]    DOB: [birth year]   Date:06/22/2020       Progress Note  Subjective  Chief Complaint  No chief complaint on file.   HPI  Patient presents for annual CPE.  Diet: *** Exercise: ***     [facility] Visit from 04/03/2020 in [facility]  AUDIT-C Score 0     Depression: Phq 9 is  {Desc; negative/positive:13464::"negative"} Depression screen [facility] 2/9 04/03/2020 09/18/2019 09/05/2019 05/03/2019 10/05/2018  Decreased Interest 0 0 0 0 0  Down, Depressed, Hopeless 0 0 0 0 0  PHQ - 2 Score 0 0 0 0 0  Altered sleeping - 0 0 0 0  Tired, decreased energy - 1 0 0 0  Change in appetite - 0 0 0 0  Feeling bad or failure about yourself  - 0 0 0 0  Trouble concentrating - 0 0 0 0  Moving slowly or fidgety/restless - 0 0 0 0  Suicidal thoughts - 0 0 0 0  PHQ-9 Score - 1 0 0 0  Difficult doing work/chores - Not difficult at all - Not difficult at all -   Hypertension: BP Readings from Last 3 Encounters:  04/03/20 138/88  09/05/19 140/90  05/03/19 126/74   Obesity: Wt Readings from Last 3 Encounters:  04/03/20 188 lb 4.8 oz (85.4 kg)  09/18/19 179 lb (81.2 kg)  09/05/19 190 lb 3.2 oz (86.3 kg)   BMI Readings from Last 3 Encounters:  04/03/20 31.33 kg/m  09/18/19 29.79 kg/m  09/05/19 32.65 kg/m     Vaccines:  HPV: up to at age 53 , ask insurance if age between 53-45  Shingrix: 87-53 yo and ask insurance if covered when patient above 53 yo Pneumonia: *** educated and discussed with patient. Flu: *** educated and discussed with patient.  Hep C Screening: 05/03/2019 STD testing and prevention (HIV/chl/gon/syphilis): *** Intimate partner violence:*** Sexual History : Menstrual History/LMP/Abnormal Bleeding:  Incontinence Symptoms:   Breast cancer:  - Last Mammogram: 08/02/2007 - BRCA gene screening: ***  Osteoporosis: Discussed high calcium and vitamin D supplementation, weight bearing exercises  Cervical cancer  screening: ***  Skin cancer: Discussed monitoring for atypical lesions  Colorectal cancer: ***   Lung cancer:  *** Low Dose CT Chest recommended if Age 53-80 years, 20 pack-year currently smoking OR have quit w/in 15years. Patient does not qualify.   ECG: 09/05/2019  Advanced Care Planning: A voluntary discussion about advance care planning including the explanation and discussion of advance directives.  Discussed health care proxy and Living will, and the patient was able to identify a health care proxy as ***.  Patient does not have a living will at present time. If patient does have living will, I have requested they bring this to the clinic to be scanned in to their chart.  Lipids: Lab Results  Component Value Date   CHOL 186 05/03/2019   CHOL 160 07/02/2015   CHOL 154 08/03/2012   Lab Results  Component Value Date   HDL 67 05/03/2019   HDL 78 07/02/2015   HDL 51 08/03/2012   Lab Results  Component Value Date   LDLCALC 109 (H) 05/03/2019   LDLCALC 73 07/02/2015   LDLCALC 92 08/03/2012   Lab Results  Component Value Date   TRIG 52 05/03/2019   TRIG 43 07/02/2015   TRIG 53 08/03/2012   Lab Results  Component Value Date   CHOLHDL 2.8 05/03/2019   CHOLHDL 2.1 07/02/2015  No results found for: LDLDIRECT  Glucose: Glucose  Date Value Ref Range Status  05/03/2019 88 65 - 99 mg/dL Final  07/02/2015 89 65 - 99 mg/dL Final   Glucose, Bld  Date Value Ref Range Status  09/11/2017 87 65 - 99 mg/dL Final    Patient Active Problem List   Diagnosis Date Noted  . Chronic deep vein thrombosis (DVT) of left lower extremity ([place]) 05/03/2019  . Hypertension 09/11/2017  . Adhesive capsulitis of right shoulder 05/12/2017  . Perennial allergic rhinitis with seasonal variation 07/02/2015  . Hypokalemia 07/02/2015  . History of DVT of lower extremity     Past Surgical History:  Procedure Laterality Date  . INSERTION OF VENA CAVA FILTER  04/2014   Dr. Lucky Cowboy   . IVC FILTER  REMOVAL N/A 12/29/2016   Procedure: IVC Filter Removal;  Surgeon: Algernon Huxley, MD;  Location: [facility];  Service: Cardiovascular;  Laterality: N/A;  . PERIPHERAL VASCULAR THROMBECTOMY Left 11/01/2016   Procedure: Peripheral Vascular Thrombectomy;  Surgeon: Algernon Huxley, MD;  Location: [facility];  Service: Cardiovascular;  Laterality: Left;  . TUBAL LIGATION      Family History  Problem Relation Age of Onset  . Diabetes Mother   . Hypertension Mother   . Hypercalcemia Father   . Leukemia Son 3  . Diabetes Maternal Grandmother   . Heart disease Paternal Grandfather     Social History   Socioeconomic History  . Marital status: Single    Spouse name: Not on file  . Number of children: 3  . Years of education: Not on file  . Highest education level: Associate degree: occupational, Hotel manager, or vocational program  Occupational History  . Not on file  Tobacco Use  . Smoking status: Former Smoker    Packs/day: 0.50    Years: 10.00    Pack years: 5.00    Types: Cigarettes    Start date: 08/01/1998    Quit date: 08/01/2008    Years since quitting: 11.8  . Smokeless tobacco: Never Used  Vaping Use  . Vaping Use: Never used  Substance and Sexual Activity  . Alcohol use: Not Currently    Alcohol/week: 0.0 standard drinks    Comment: many years ago  . Drug use: Never  . Sexual activity: Not Currently  Other Topics Concern  . Not on file  Social History Narrative  . Not on file   Social Determinants of Health   Financial Resource Strain:   . Difficulty of Paying Living Expenses: Not on file  Food Insecurity:   . Worried About Charity fundraiser in the Last Year: Not on file  . Ran Out of Food in the Last Year: Not on file  Transportation Needs:   . Lack of Transportation (Medical): Not on file  . Lack of Transportation (Non-Medical): Not on file  Physical Activity:   . Days of Exercise per Week: Not on file  . Minutes of Exercise per Session: Not on  file  Stress:   . Feeling of Stress : Not on file  Social Connections:   . Frequency of Communication with Friends and Family: Not on file  . Frequency of Social Gatherings with Friends and Family: Not on file  . Attends Religious Services: Not on file  . Active Member of Clubs or Organizations: Not on file  . Attends Archivist Meetings: Not on file  . Marital Status: Not on file  Intimate Partner Violence:   .  Fear of Current or Ex-Partner: Not on file  . Emotionally Abused: Not on file  . Physically Abused: Not on file  . Sexually Abused: Not on file     Current Outpatient Medications:  .  acetaminophen (TYLENOL) 500 MG tablet, Take 1,000 mg by mouth 2 (two) times daily as needed for moderate pain., Disp: , Rfl:  .  albuterol (VENTOLIN HFA) 108 (90 Base) MCG/ACT inhaler, Inhale 2 puffs into the lungs every 4 (four) hours as needed for wheezing or shortness of breath., Disp: 18 g, Rfl: 1 .  amLODipine (NORVASC) 2.5 MG tablet, Take 1 tablet (2.5 mg total) by mouth daily., Disp: 90 tablet, Rfl: 1 .  fluticasone furoate-vilanterol (BREO ELLIPTA) 100-25 MCG/INH AEPB, Inhale 1 puff into the lungs daily., Disp: 180 each, Rfl: 1 .  loratadine (CLARITIN) 10 MG tablet, Take 1 tablet (10 mg total) by mouth daily., Disp: 30 tablet, Rfl: 5 .  losartan-hydrochlorothiazide (HYZAAR) 50-12.5 MG tablet, Take 1 tablet by mouth daily., Disp: 90 tablet, Rfl: 1 .  montelukast (SINGULAIR) 10 MG tablet, Take 1 tablet (10 mg total) by mouth at bedtime., Disp: 90 tablet, Rfl: 1 .  Multiple Vitamin (MULTIVITAMIN WITH MINERALS) TABS tablet, Take 1 tablet by mouth daily., Disp: , Rfl:   No Known Allergies   ROS  ***  Objective  There were no vitals filed for this visit.  There is no height or weight on file to calculate BMI.  Physical Exam ***  No results found for this or any previous visit (from the past 2160 hour(s)).  Diabetic Foot Exam: Diabetic Foot Exam - Simple   No data filed      ***  Fall Risk: Fall Risk  04/03/2020 09/18/2019 09/05/2019 05/03/2019 05/09/2018  Falls in the past year? 0 0 0 0 No  Number falls in past yr: 0 0 0 0 -  Injury with Fall? 0 0 0 0 -   ***  Functional Status Survey:   ***  Assessment & Plan  There are no diagnoses linked to this encounter.  -USPSTF grade A and B recommendations reviewed with patient; age-appropriate recommendations, preventive care, screening tests, etc discussed and encouraged; healthy living encouraged; see AVS for patient education given to patient -Discussed importance of 150 minutes of physical activity weekly, eat two servings of fish weekly, eat one serving of tree nuts ( cashews, pistachios, pecans, almonds.Marland Kitchen) every other day, eat 6 servings of fruit/vegetables daily and drink plenty of water and avoid sweet beverages.   -Reviewed Health Maintenance: ***

## 2020-06-29 ENCOUNTER — Other Ambulatory Visit: Payer: Self-pay | Admitting: Family Medicine

## 2020-06-29 DIAGNOSIS — Z1231 Encounter for screening mammogram for malignant neoplasm of breast: Secondary | ICD-10-CM

## 2020-09-30 NOTE — Progress Notes (Unsigned)
Name: [name] [name]   MRN: [id number]    DOB: June 02, 1966   Date:10/02/2020       Progress Note  Subjective  Chief Complaint  Follow up   HPI   Asthma: she is doing well Breo and prn albuterol, no recent flares of asthma, denies cough, wheezing or SOB. She uses rescue inhaler very seldom   Grieving: lost her mother, father is in [place] and she is worried about him. Phq 9 is positive but likely from grieving   HTN: shestates that she has been taking medications, bp has been well controlled, no dizziness, bp today is still borderline and we will adjust dose of losartan hctz to 100/12.5 and continue norvasc 2.5 mg   Chronic DVT: she was advised to stay on Xarelto for life, but stopped taking medication on her own at the end of 2020 .  She was seen by Dr. Wyn Quaker and hematologist. She has a history of IVF placed and removed in 2018  and alsohad a stent placed because of recurrent DVT. Last episode was 2017, took 3 years of Xarelto, stopped on her own but has been taking aspirin 81 mg daily since .She denies side effects, she is aware of risk of recurrence.   GERD: she has been doing well on PPI, no chest pain, heartburn or regurgitation at this time  Obesity: she had gained almost 20 lbs since 2018. She is down a few pounds since last visit.    Patient Active Problem List   Diagnosis Date Noted  . Chronic deep vein thrombosis (DVT) of left lower extremity (HCC) 05/03/2019  . Hypertension 09/11/2017  . Adhesive capsulitis of right shoulder 05/12/2017  . Perennial allergic rhinitis with seasonal variation 07/02/2015  . Hypokalemia 07/02/2015  . History of DVT of lower extremity     Past Surgical History:  Procedure Laterality Date  . INSERTION OF VENA CAVA FILTER  04/2014   Dr. Wyn Quaker   . IVC FILTER REMOVAL N/A 12/29/2016   Procedure: IVC Filter Removal;  Surgeon: Annice Needy, MD;  Location: [facility] INVASIVE CV LAB;  Service: Cardiovascular;  Laterality: N/A;  . PERIPHERAL  VASCULAR THROMBECTOMY Left 11/01/2016   Procedure: Peripheral Vascular Thrombectomy;  Surgeon: Annice Needy, MD;  Location: [facility] INVASIVE CV LAB;  Service: Cardiovascular;  Laterality: Left;  . TUBAL LIGATION      Family History  Problem Relation Age of Onset  . Diabetes Mother   . Hypertension Mother   . Hypercalcemia Father   . Leukemia Son 3  . Diabetes Maternal Grandmother   . Heart disease Paternal Grandfather     Social History   Tobacco Use  . Smoking status: Former Smoker    Packs/day: 0.50    Years: 10.00    Pack years: 5.00    Types: Cigarettes    Start date: 08/01/1998    Quit date: 08/01/2008    Years since quitting: 12.1  . Smokeless tobacco: Never Used  Substance Use Topics  . Alcohol use: Not Currently    Alcohol/week: 0.0 standard drinks    Comment: many years ago     Current Outpatient Medications:  .  acetaminophen (TYLENOL) 500 MG tablet, Take 1,000 mg by mouth 2 (two) times daily as needed for moderate pain., Disp: , Rfl:  .  albuterol (VENTOLIN HFA) 108 (90 Base) MCG/ACT inhaler, Inhale 2 puffs into the lungs every 4 (four) hours as needed for wheezing or shortness of breath., Disp: 18 g, Rfl: 1 .  amLODipine (  NORVASC) 2.5 MG tablet, Take 1 tablet (2.5 mg total) by mouth daily., Disp: 90 tablet, Rfl: 1 .  fluticasone furoate-vilanterol (BREO ELLIPTA) 100-25 MCG/INH AEPB, Inhale 1 puff into the lungs daily., Disp: 180 each, Rfl: 1 .  loratadine (CLARITIN) 10 MG tablet, Take 1 tablet (10 mg total) by mouth daily., Disp: 30 tablet, Rfl: 5 .  losartan-hydrochlorothiazide (HYZAAR) 50-12.5 MG tablet, Take 1 tablet by mouth daily., Disp: 90 tablet, Rfl: 1 .  montelukast (SINGULAIR) 10 MG tablet, Take 1 tablet (10 mg total) by mouth at bedtime., Disp: 90 tablet, Rfl: 1 .  Multiple Vitamin (MULTIVITAMIN WITH MINERALS) TABS tablet, Take 1 tablet by mouth daily., Disp: , Rfl:   No Known Allergies  I personally reviewed active problem list, medication list, allergies,  family history, social history, health maintenance with the patient/caregiver today.   ROS  Constitutional: Negative for fever or weight change.  Respiratory: Negative for cough and shortness of breath.   Cardiovascular: Negative for chest pain or palpitations.  Gastrointestinal: Negative for abdominal pain, no bowel changes.  Musculoskeletal: Negative for gait problem or joint swelling.  Skin: Negative for rash.  Neurological: Negative for dizziness or headache.  No other specific complaints in a complete review of systems (except as listed in HPI above).  Objective  Vitals:   10/02/20 1118  BP: 132/88  Pulse: 98  Resp: 16  Temp: 98.5 F (36.9 C)  TempSrc: Oral  SpO2: 99%  Weight: 185 lb 9.6 oz (84.2 kg)  Height: 5\' 5"  (1.651 m)    Body mass index is 30.89 kg/m.  Physical Exam  Constitutional: Patient appears well-developed and well-nourished. Obese  No distress.  HEENT: head atraumatic, normocephalic, pupils equal and reactive to light,  neck supple Cardiovascular: Normal rate, regular rhythm and normal heart sounds.  No murmur heard. No BLE edema. Pulmonary/Chest: Effort normal and breath sounds normal. No respiratory distress. Abdominal: Soft.  There is no tenderness. Psychiatric: Patient has a normal mood and affect. behavior is normal. Judgment and thought content normal.  PHQ2/9: Depression screen [facility] 2/9 10/02/2020 04/03/2020 09/18/2019 09/05/2019 05/03/2019  Decreased Interest 1 0 0 0 0  Down, Depressed, Hopeless 1 0 0 0 0  PHQ - 2 Score 2 0 0 0 0  Altered sleeping 3 - 0 0 0  Tired, decreased energy 1 - 1 0 0  Change in appetite 0 - 0 0 0  Feeling bad or failure about yourself  0 - 0 0 0  Trouble concentrating 0 - 0 0 0  Moving slowly or fidgety/restless 0 - 0 0 0  Suicidal thoughts 0 - 0 0 0  PHQ-9 Score 6 - 1 0 0  Difficult doing work/chores - - Not difficult at all - Not difficult at all    phq 9 is positive -grieving    Fall Risk: Fall Risk  10/02/2020  04/03/2020 09/18/2019 09/05/2019 05/03/2019  Falls in the past year? 0 0 0 0 0  Number falls in past yr: 0 0 0 0 0  Injury with Fall? 0 0 0 0 0     Functional Status Survey: Is the patient deaf or have difficulty hearing?: No Does the patient have difficulty seeing, even when wearing glasses/contacts?: No Does the patient have difficulty concentrating, remembering, or making decisions?: No Does the patient have difficulty walking or climbing stairs?: No Does the patient have difficulty dressing or bathing?: No Does the patient have difficulty doing errands alone such as visiting a doctor's office or  shopping?: No    Assessment & Plan  1. Chronic deep vein thrombosis (DVT) of left lower extremity, unspecified vein (HCC)  Refused anti-coagulation  2. Asthma, moderate persistent, well-controlled  - fluticasone furoate-vilanterol (BREO ELLIPTA) 100-25 MCG/INH AEPB; Inhale 1 puff into the lungs daily.  Dispense: 180 each; Refill: 1 - montelukast (SINGULAIR) 10 MG tablet; Take 1 tablet (10 mg total) by mouth at bedtime.  Dispense: 90 tablet; Refill: 1  3. GERD without esophagitis   4. Essential hypertension  - amLODipine (NORVASC) 2.5 MG tablet; Take 1 tablet (2.5 mg total) by mouth daily.  Dispense: 90 tablet; Refill: 1 - losartan-hydrochlorothiazide (HYZAAR) 100-12.5 MG tablet; Take 1 tablet by mouth daily.  Dispense: 90 tablet; Refill: 1  5. Perennial allergic rhinitis with seasonal variation  Stable   6. Grieving  Discussed hospice counseling

## 2020-10-02 ENCOUNTER — Other Ambulatory Visit: Payer: Self-pay

## 2020-10-02 ENCOUNTER — Encounter: Payer: Self-pay | Admitting: Family Medicine

## 2020-10-02 ENCOUNTER — Ambulatory Visit: Payer: BC Managed Care – PPO | Admitting: Family Medicine

## 2020-10-02 VITALS — BP 132/88 | HR 98 | Temp 98.5°F | Resp 16 | Ht 65.0 in | Wt 185.6 lb

## 2020-10-02 DIAGNOSIS — I82502 Chronic embolism and thrombosis of unspecified deep veins of left lower extremity: Secondary | ICD-10-CM | POA: Diagnosis not present

## 2020-10-02 DIAGNOSIS — J4540 Moderate persistent asthma, uncomplicated: Secondary | ICD-10-CM | POA: Diagnosis not present

## 2020-10-02 DIAGNOSIS — I10 Essential (primary) hypertension: Secondary | ICD-10-CM | POA: Diagnosis not present

## 2020-10-02 DIAGNOSIS — J3089 Other allergic rhinitis: Secondary | ICD-10-CM

## 2020-10-02 DIAGNOSIS — K219 Gastro-esophageal reflux disease without esophagitis: Secondary | ICD-10-CM

## 2020-10-02 DIAGNOSIS — F4321 Adjustment disorder with depressed mood: Secondary | ICD-10-CM

## 2020-10-02 DIAGNOSIS — J302 Other seasonal allergic rhinitis: Secondary | ICD-10-CM

## 2020-10-02 MED ORDER — LOSARTAN POTASSIUM-HCTZ 100-12.5 MG PO TABS: 1.0000 | ORAL_TABLET | Freq: Every day | ORAL | 1 refills | Status: DC

## 2020-10-02 MED ORDER — AMLODIPINE BESYLATE 2.5 MG PO TABS: 2.5000 mg | ORAL_TABLET | Freq: Every day | ORAL | 1 refills | Status: DC

## 2020-10-02 MED ORDER — MONTELUKAST SODIUM 10 MG PO TABS: 10.0000 mg | ORAL_TABLET | Freq: Every day | ORAL | 1 refills | Status: DC

## 2020-10-02 MED ORDER — BREO ELLIPTA 100-25 MCG/INH IN AEPB: 1.0000 | INHALATION_SPRAY | Freq: Every day | RESPIRATORY_TRACT | 1 refills | Status: DC

## 2020-12-07 ENCOUNTER — Other Ambulatory Visit: Payer: Self-pay | Admitting: Family Medicine

## 2020-12-07 NOTE — Telephone Encounter (Signed)
lvm to inform that prescription has been sent to pharmacy. Pt is to keep her September appt

## 2020-12-07 NOTE — Telephone Encounter (Signed)
Pt has an appt on 04/06/21 

## 2020-12-07 NOTE — Telephone Encounter (Signed)
  Notes to clinic: script last filled on 08/28/2020 Review for continued use and refill    Requested Prescriptions  Pending Prescriptions Disp Refills   albuterol (VENTOLIN HFA) 108 (90 Base) MCG/ACT inhaler [Pharmacy Med Name: ALBUTEROL HFA INH (200 PUFFS)8.5GM] 25.5 g     Sig: INHALE 2 PUFFS BY MOUTH EVERY 6 HOURS AS NEEDED FOR WHEEZING OR SHORTNESS OF BREATH      Pulmonology:  Beta Agonists Failed - 12/07/2020  9:06 AM      Failed - One inhaler should last at least one month. If the patient is requesting refills earlier, contact the patient to check for uncontrolled symptoms.      Passed - Valid encounter within last 12 months    Recent Outpatient Visits           2 months ago Chronic deep vein thrombosis (DVT) of left lower extremity, unspecified vein [facility])   [facility] Alba Cory, MD   8 months ago Chronic deep vein thrombosis (DVT) of left lower extremity, unspecified vein [facility])   [facility] Alba Cory, MD   1 year ago Costochondral chest pain   [facility] Alba Cory, MD   1 year ago Other chest pain   [facility] Welford Roche D, MD   1 year ago Chronic deep vein thrombosis (DVT) of left lower extremity, unspecified vein [facility])   [facility] Alba Cory, MD       Future Appointments             In 4 months Carlynn Purl, Danna Hefty, MD [facility]

## 2021-02-08 ENCOUNTER — Telehealth (INDEPENDENT_AMBULATORY_CARE_PROVIDER_SITE_OTHER): Payer: Self-pay | Admitting: Vascular Surgery

## 2021-02-08 NOTE — Telephone Encounter (Signed)
Patient states she had a stint placed by our practice back in 2018.  She needs to get a MRI done at her Ortho doctor and will need clearance.  Patient is also stating she will need surgical notes from procedure of when the stint was placed.  Please advise.

## 2021-02-08 NOTE — Telephone Encounter (Signed)
Left a message on the patient voicemail to have the ortho physician office fax over a clearance to our office.

## 2021-04-02 NOTE — Progress Notes (Deleted)
Name: [name] [name]   MRN: [id number]    DOB: 07/27/1966   Date:04/02/2021       Progress Note  Subjective  Chief Complaint  Annual Exam  HPI  Patient presents for annual CPE and follow up.  Asthma: she is doing well Breo and prn albuterol, no recent flares of asthma, denies cough, wheezing or SOB. She uses rescue inhaler very seldom   Grieving: lost her mother, father is in [place] and she is worried about him. Phq 9 is positive but likely from grieving    HTN: she states that she has been taking medications, bp has been well controlled, no dizziness, bp today is still borderline and we will adjust dose of losartan hctz to 100/12.5 and continue norvasc 2.5 mg    Chronic DVT: she was advised to stay on Xarelto for life, but stopped taking medication on her own at the end of 2020 .  She was seen by Dr. Lucky Cowboy and hematologist. She has a  history of IVF placed and removed in 2018  and also had a stent placed because of recurrent DVT. Last episode was 2017, took 3 years of Xarelto, stopped on her own but has been taking aspirin 81 mg daily since .She denies side effects, she is aware of risk of recurrence.    GERD: she has been doing well on PPI, no chest pain, heartburn or regurgitation at this time   Obesity: she had gained almost 20 lbs since 2018. She is down a few pounds since last visit.     Diet: *** Exercise: ***   Flowsheet Row Office Visit from 04/03/2020 in [place]  AUDIT-C Score 0      Depression: Phq 9 is  {Desc; negative/positive:13464} Depression screen [facility] 2/9 10/02/2020 04/03/2020 09/18/2019 09/05/2019 05/03/2019  Decreased Interest 1 0 0 0 0  Down, Depressed, Hopeless 1 0 0 0 0  PHQ - 2 Score 2 0 0 0 0  Altered sleeping 3 - 0 0 0  Tired, decreased energy 1 - 1 0 0  Change in appetite 0 - 0 0 0  Feeling bad or failure about yourself  0 - 0 0 0  Trouble concentrating 0 - 0 0 0  Moving slowly or fidgety/restless 0 - 0 0 0  Suicidal thoughts  0 - 0 0 0  PHQ-9 Score 6 - 1 0 0  Difficult doing work/chores - - Not difficult at all - Not difficult at all   Hypertension: BP Readings from Last 3 Encounters:  10/02/20 132/88  04/03/20 138/88  09/05/19 140/90   Obesity: Wt Readings from Last 3 Encounters:  10/02/20 185 lb 9.6 oz (84.2 kg)  04/03/20 188 lb 4.8 oz (85.4 kg)  09/18/19 179 lb (81.2 kg)   BMI Readings from Last 3 Encounters:  10/02/20 30.89 kg/m  04/03/20 31.33 kg/m  09/18/19 29.79 kg/m     Vaccines:  HPV: up to at age 54 , ask insurance if age between 74-45  Shingrix: 54-64 yo and ask insurance if covered when patient above 30 yo Pneumonia: educated and discussed with patient. Flu: educated and discussed with patient.  Hep C Screening: 05/03/19 STD testing and prevention (HIV/chl/gon/syphilis): 05/03/19 Intimate partner violence: negative Sexual History : Menstrual History/LMP/Abnormal Bleeding:  Incontinence Symptoms:   Breast cancer:  - Last Mammogram: ordered 06/29/20 - BRCA gene screening: N/A  Osteoporosis: Discussed high calcium and vitamin D supplementation, weight bearing exercises  Cervical cancer screening: today  Skin cancer: Discussed  monitoring for atypical lesions  Colorectal cancer: N/A   Lung cancer: Low Dose CT Chest recommended if Age 48-80 years, 20 pack-year currently smoking OR have quit w/in 15years. Patient does not qualify.   ECG: 09/05/19  Advanced Care Planning: A voluntary discussion about advance care planning including the explanation and discussion of advance directives.  Discussed health care proxy and Living will, and the patient was able to identify a health care proxy as ***.  Patient does not have a living will at present time. If patient does have living will, I have requested they bring this to the clinic to be scanned in to their chart.  Lipids: Lab Results  Component Value Date   CHOL 186 05/03/2019   CHOL 160 07/02/2015   CHOL 154 08/03/2012   Lab Results   Component Value Date   HDL 67 05/03/2019   HDL 78 07/02/2015   HDL 51 08/03/2012   Lab Results  Component Value Date   LDLCALC 109 (H) 05/03/2019   LDLCALC 73 07/02/2015   LDLCALC 92 08/03/2012   Lab Results  Component Value Date   TRIG 52 05/03/2019   TRIG 43 07/02/2015   TRIG 53 08/03/2012   Lab Results  Component Value Date   CHOLHDL 2.8 05/03/2019   CHOLHDL 2.1 07/02/2015   No results found for: LDLDIRECT  Glucose: Glucose  Date Value Ref Range Status  05/03/2019 88 65 - 99 mg/dL Final  07/02/2015 89 65 - 99 mg/dL Final   Glucose, Bld  Date Value Ref Range Status  09/11/2017 87 65 - 99 mg/dL Final    Patient Active Problem List   Diagnosis Date Noted   Chronic deep vein thrombosis (DVT) of left lower extremity ([place]) 05/03/2019   Hypertension 09/11/2017   Adhesive capsulitis of right shoulder 05/12/2017   Perennial allergic rhinitis with seasonal variation 07/02/2015   Hypokalemia 07/02/2015    Past Surgical History:  Procedure Laterality Date   INSERTION OF VENA CAVA FILTER  04/2014   Dr. Lucky Cowboy    IVC FILTER REMOVAL N/A 12/29/2016   Procedure: IVC Filter Removal;  Surgeon: Algernon Huxley, MD;  Location: [place] CV LAB;  Service: Cardiovascular;  Laterality: N/A;   PERIPHERAL VASCULAR THROMBECTOMY Left 11/01/2016   Procedure: Peripheral Vascular Thrombectomy;  Surgeon: Algernon Huxley, MD;  Location: [place] CV LAB;  Service: Cardiovascular;  Laterality: Left;   TUBAL LIGATION      Family History  Problem Relation Age of Onset   Diabetes Mother    Hypertension Mother    Hypercalcemia Father    Leukemia Son 3   Diabetes Maternal Grandmother    Heart disease Paternal Grandfather     Social History   Socioeconomic History   Marital status: Single    Spouse name: Not on file   Number of children: 3   Years of education: Not on file   Highest education level: Associate degree: occupational, Hotel manager, or vocational program  Occupational History    Not on file  Tobacco Use   Smoking status: Former    Packs/day: 0.50    Years: 10.00    Pack years: 5.00    Types: Cigarettes    Start date: 08/01/1998    Quit date: 08/01/2008    Years since quitting: 12.6   Smokeless tobacco: Never  Vaping Use   Vaping Use: Never used  Substance and Sexual Activity   Alcohol use: Not Currently    Alcohol/week: 0.0 standard drinks    Comment:  many years ago   Drug use: Never   Sexual activity: Not Currently  Other Topics Concern   Not on file  Social History Narrative   Not on file   Social Determinants of Health   Financial Resource Strain: Not on file  Food Insecurity: Not on file  Transportation Needs: Not on file  Physical Activity: Not on file  Stress: Not on file  Social Connections: Not on file  Intimate Partner Violence: Not on file     Current Outpatient Medications:    acetaminophen (TYLENOL) 500 MG tablet, Take 1,000 mg by mouth 2 (two) times daily as needed for moderate pain., Disp: , Rfl:    albuterol (VENTOLIN HFA) 108 (90 Base) MCG/ACT inhaler, INHALE 2 PUFFS BY MOUTH EVERY 6 HOURS AS NEEDED FOR WHEEZING OR SHORTNESS OF BREATH, Disp: 25.5 g, Rfl: 0   amLODipine (NORVASC) 2.5 MG tablet, Take 1 tablet (2.5 mg total) by mouth daily., Disp: 90 tablet, Rfl: 1   fluticasone furoate-vilanterol (BREO ELLIPTA) 100-25 MCG/INH AEPB, Inhale 1 puff into the lungs daily., Disp: 180 each, Rfl: 1   loratadine (CLARITIN) 10 MG tablet, Take 1 tablet (10 mg total) by mouth daily., Disp: 30 tablet, Rfl: 5   losartan-hydrochlorothiazide (HYZAAR) 100-12.5 MG tablet, Take 1 tablet by mouth daily., Disp: 90 tablet, Rfl: 1   montelukast (SINGULAIR) 10 MG tablet, Take 1 tablet (10 mg total) by mouth at bedtime., Disp: 90 tablet, Rfl: 1   Multiple Vitamin (MULTIVITAMIN WITH MINERALS) TABS tablet, Take 1 tablet by mouth daily., Disp: , Rfl:   No Known Allergies   ROS  ***  Objective  There were no vitals filed for this visit.  There is no  height or weight on file to calculate BMI.  Physical Exam ***  No results found for this or any previous visit (from the past 2160 hour(s)).    Fall Risk: Fall Risk  10/02/2020 04/03/2020 09/18/2019 09/05/2019 05/03/2019  Falls in the past year? 0 0 0 0 0  Number falls in past yr: 0 0 0 0 0  Injury with Fall? 0 0 0 0 0   ***  Functional Status Survey:   ***  Assessment & Plan  There are no diagnoses linked to this encounter.  -USPSTF grade A and B recommendations reviewed with patient; age-appropriate recommendations, preventive care, screening tests, etc discussed and encouraged; healthy living encouraged; see AVS for patient education given to patient -Discussed importance of 150 minutes of physical activity weekly, eat two servings of fish weekly, eat one serving of tree nuts ( cashews, pistachios, pecans, almonds.Marland Kitchen) every other day, eat 6 servings of fruit/vegetables daily and drink plenty of water and avoid sweet beverages.   -Reviewed Health Maintenance: ***

## 2021-04-06 ENCOUNTER — Encounter: Payer: BC Managed Care – PPO | Admitting: Family Medicine

## 2021-04-06 DIAGNOSIS — Z124 Encounter for screening for malignant neoplasm of cervix: Secondary | ICD-10-CM

## 2021-04-06 DIAGNOSIS — Z0000 Encounter for general adult medical examination without abnormal findings: Secondary | ICD-10-CM

## 2021-04-27 ENCOUNTER — Telehealth: Payer: Self-pay | Admitting: Family Medicine

## 2021-04-27 DIAGNOSIS — I10 Essential (primary) hypertension: Secondary | ICD-10-CM

## 2021-04-28 NOTE — Telephone Encounter (Signed)
Lvm for pt to call and schedule an appt  

## 2021-06-14 ENCOUNTER — Other Ambulatory Visit: Payer: Self-pay | Admitting: Family Medicine

## 2021-06-14 DIAGNOSIS — I10 Essential (primary) hypertension: Secondary | ICD-10-CM

## 2021-06-14 NOTE — Telephone Encounter (Signed)
Medication Refill - Medication: losartan-hydrochlorothiazide (HYZAAR) 100-12.5 MG tablet  Has the patient contacted their pharmacy? Yes.   (Was told to call her doctor Preferred Pharmacy (with phone number or street name): [facility] [phone] - [place], [place] - [address] Has the patient been seen for an appointment in the last year OR does the patient have an upcoming appointment? Yes.    Please send a 30 day courtesy refill to her pharmacy.  She is taking her last bp pill today and would appreciate enough to get her through to appt

## 2021-06-14 NOTE — Telephone Encounter (Signed)
Requested medications are due for refill today yes  Requested medications are on the active medication list yes  Last visit 10/02/20  Future visit scheduled 07/14/21  Notes to clinic failed protocol of labs within 180 days, last valid labs were 05/03/2019. Please assess.  Requested Prescriptions  Pending Prescriptions Disp Refills   losartan-hydrochlorothiazide (HYZAAR) 100-12.5 MG tablet 30 tablet 0    Sig: Take 1 tablet by mouth daily.     Cardiovascular: ARB + Diuretic Combos Failed - 06/14/2021  4:21 PM      Failed - K in normal range and within 180 days    Potassium  Date Value Ref Range Status  05/03/2019 3.3 (L) 3.5 - 5.2 mmol/L Final          Failed - Na in normal range and within 180 days    Sodium  Date Value Ref Range Status  05/03/2019 139 134 - 144 mmol/L Final          Failed - Cr in normal range and within 180 days    Creatinine  Date Value Ref Range Status  07/09/2014 0.84 0.60 - 1.30 mg/dL Final   Creatinine, Ser  Date Value Ref Range Status  05/03/2019 0.82 0.57 - 1.00 mg/dL Final          Failed - Ca in normal range and within 180 days    Calcium  Date Value Ref Range Status  05/03/2019 9.7 8.7 - 10.2 mg/dL Final          Failed - Valid encounter within last 6 months    Recent Outpatient Visits           8 months ago Chronic deep vein thrombosis (DVT) of left lower extremity, unspecified vein [facility])   [facility] Alba Cory, MD   1 year ago Chronic deep vein thrombosis (DVT) of left lower extremity, unspecified vein [facility])   [facility] Alba Cory, MD   1 year ago Costochondral chest pain   [facility] Alba Cory, MD   1 year ago Other chest pain   [facility] Welford Roche D, MD   2 years ago Chronic deep vein thrombosis (DVT) of left lower extremity, unspecified vein [facility])   [facility] Alba Cory, MD        Future Appointments             In 1 month Alba Cory, MD [facility]            Passed - Patient is not pregnant      Passed - Last BP in normal range    BP Readings from Last 1 Encounters:  10/02/20 132/88

## 2021-06-15 ENCOUNTER — Other Ambulatory Visit: Payer: Self-pay

## 2021-06-16 ENCOUNTER — Other Ambulatory Visit: Payer: Self-pay | Admitting: Family Medicine

## 2021-06-16 DIAGNOSIS — I10 Essential (primary) hypertension: Secondary | ICD-10-CM

## 2021-06-16 MED ORDER — LOSARTAN POTASSIUM-HCTZ 100-12.5 MG PO TABS: 1.0000 | ORAL_TABLET | Freq: Every day | ORAL | 0 refills | Status: DC

## 2021-06-18 ENCOUNTER — Other Ambulatory Visit: Payer: Self-pay | Admitting: Family Medicine

## 2021-06-18 DIAGNOSIS — I10 Essential (primary) hypertension: Secondary | ICD-10-CM

## 2021-07-13 NOTE — Progress Notes (Signed)
Name: [name] [name]   MRN: [id number]    DOB: [birth year]   Date:07/14/2021       Progress Note  Subjective  Chief Complaint  Follow up   HPI  Asthma: she is doing well Breo and prn albuterol, no recent flares of asthma, denies cough, wheezing or SOB. She uses rescue inhaler very seldom . Did not have a flare this Fall    HTN: she stopped taking norvasc because her bp was dropped. She is compliant with losartan hctz, denies side effects of medication. Denies chest pain or palpitation    Chronic DVT: she was advised to stay on Xarelto for life, but stopped taking medication on her own at the end of 2020 .  She was seen by Dr. Wyn Quaker and hematologist. She has a  history of IVF placed and removed in 2018  and also had a stent placed because of recurrent DVT. Last episode was 2017, took 3 years of Xarelto, stopped on her own but has been taking aspirin 81 mg daily since .She denies side effects, she is aware of risk of recurrence. No pain or swelling on her legs   Obesity: she had gained almost 20 lbs from 2018 to 2022, less active since the pandemic, but lost weight since last visit, down 7 lbs. She has a puppy and will try to be more active   AR: doing well at this time, denies rhinorrhea, nasal congestion   GERD: doing well, off medication and symptoms controlled   Patient Active Problem List   Diagnosis Date Noted   Chronic deep vein thrombosis (DVT) of left lower extremity (HCC) 05/03/2019   Hypertension 09/11/2017   Adhesive capsulitis of right shoulder 05/12/2017   Perennial allergic rhinitis with seasonal variation 07/02/2015   Hypokalemia 07/02/2015    Past Surgical History:  Procedure Laterality Date   INSERTION OF VENA CAVA FILTER  04/2014   Dr. Wyn Quaker    IVC FILTER REMOVAL N/A 12/29/2016   Procedure: IVC Filter Removal;  Surgeon: Annice Needy, MD;  Location: [facility];  Service: Cardiovascular;  Laterality: N/A;   PERIPHERAL VASCULAR THROMBECTOMY Left 11/01/2016    Procedure: Peripheral Vascular Thrombectomy;  Surgeon: Annice Needy, MD;  Location: [facility];  Service: Cardiovascular;  Laterality: Left;   TUBAL LIGATION      Family History  Problem Relation Age of Onset   Diabetes Mother    Hypertension Mother    Hypercalcemia Father    Leukemia Son 3   Diabetes Maternal Grandmother    Heart disease Paternal Grandfather     Social History   Tobacco Use   Smoking status: Former    Packs/day: 0.50    Years: 10.00    Pack years: 5.00    Types: Cigarettes    Start date: 08/01/1998    Quit date: 08/01/2008    Years since quitting: 12.9   Smokeless tobacco: Never  Substance Use Topics   Alcohol use: Not Currently    Alcohol/week: 0.0 standard drinks    Comment: many years ago     Current Outpatient Medications:    acetaminophen (TYLENOL) 500 MG tablet, Take 1,000 mg by mouth 2 (two) times daily as needed for moderate pain., Disp: , Rfl:    albuterol (VENTOLIN HFA) 108 (90 Base) MCG/ACT inhaler, INHALE 2 PUFFS BY MOUTH EVERY 6 HOURS AS NEEDED FOR WHEEZING OR SHORTNESS OF BREATH, Disp: 25.5 g, Rfl: 0   amLODipine (NORVASC) 2.5 MG tablet, Take 1 tablet (  2.5 mg total) by mouth daily., Disp: 90 tablet, Rfl: 1   fluticasone furoate-vilanterol (BREO ELLIPTA) 100-25 MCG/INH AEPB, Inhale 1 puff into the lungs daily., Disp: 180 each, Rfl: 1   loratadine (CLARITIN) 10 MG tablet, Take 1 tablet (10 mg total) by mouth daily., Disp: 30 tablet, Rfl: 5   loratadine (CLARITIN) 10 MG tablet, in the morning., Disp: , Rfl:    losartan-hydrochlorothiazide (HYZAAR) 100-12.5 MG tablet, Take 1 tablet by mouth daily., Disp: 30 tablet, Rfl: 0   montelukast (SINGULAIR) 10 MG tablet, Take 1 tablet (10 mg total) by mouth at bedtime., Disp: 90 tablet, Rfl: 1   Multiple Vitamin (MULTIVITAMIN WITH MINERALS) TABS tablet, Take 1 tablet by mouth daily., Disp: , Rfl:   No Known Allergies  I personally reviewed active problem list, medication list, allergies, family  history, social history with the patient/caregiver today.   ROS  Constitutional: Negative for fever , positive for weight change.  Respiratory: Negative for cough and shortness of breath.   Cardiovascular: Negative for chest pain or palpitations.  Gastrointestinal: Negative for abdominal pain, no bowel changes.  Musculoskeletal: Negative for gait problem or joint swelling.  Skin: Negative for rash.  Neurological: Negative for dizziness or headache.  No other specific complaints in a complete review of systems (except as listed in HPI above).   Objective  Vitals:   07/14/21 1435  BP: 128/82  Pulse: 77  Resp: 16  Temp: 98 F (36.7 C)  TempSrc: Oral  SpO2: 98%  Weight: 178 lb (80.7 kg)  Height: 5\' 5"  (1.651 m)    Body mass index is 29.62 kg/m.  Physical Exam  Constitutional: Patient appears well-developed and well-nourished. Overweight.  No distress.  HEENT: head atraumatic, normocephalic, pupils equal and reactive to light, neck supple, throat within normal limits Cardiovascular: Normal rate, regular rhythm and normal heart sounds.  No murmur heard. No BLE edema. Pulmonary/Chest: Effort normal and breath sounds normal. No respiratory distress. Abdominal: Soft.  There is no tenderness. Pelvic: normal vaginal walls, no discharge, normal cervix, bimanual exam negative, urethra also normal  Psychiatric: Patient has a normal mood and affect. behavior is normal. Judgment and thought content normal.   PHQ2/9: Depression screen [facility] 2/9 07/14/2021 10/02/2020 04/03/2020 09/18/2019 09/05/2019  Decreased Interest 0 1 0 0 0  Down, Depressed, Hopeless 0 1 0 0 0  PHQ - 2 Score 0 2 0 0 0  Altered sleeping 0 3 - 0 0  Tired, decreased energy 0 1 - 1 0  Change in appetite 0 0 - 0 0  Feeling bad or failure about yourself  0 0 - 0 0  Trouble concentrating 0 0 - 0 0  Moving slowly or fidgety/restless 0 0 - 0 0  Suicidal thoughts 0 0 - 0 0  PHQ-9 Score 0 6 - 1 0  Difficult doing work/chores -  - - Not difficult at all -    phq 9 is positive   Fall Risk: Fall Risk  07/14/2021 10/02/2020 04/03/2020 09/18/2019 09/05/2019  Falls in the past year? 0 0 0 0 0  Number falls in past yr: - 0 0 0 0  Injury with Fall? - 0 0 0 0  Follow up Falls prevention discussed - - - -     Assessment & Plan 1. Colon cancer screening  - Fecal occult blood, imunochemical  2. Breast cancer screening by mammogram  - MM 3D SCREEN BREAST BILATERAL; Future  3. Cervical cancer screening  - Pap LB (liquid-based)  4. Asthma, moderate persistent, well-controlled  - montelukast (SINGULAIR) 10 MG tablet; Take 1 tablet (10 mg total) by mouth at bedtime.  Dispense: 90 tablet; Refill: 1 - albuterol (VENTOLIN HFA) 108 (90 Base) MCG/ACT inhaler; INHALE 2 PUFFS BY MOUTH EVERY 6 HOURS AS NEEDED FOR WHEEZING OR SHORTNESS OF BREATH  Dispense: 25.5 g; Refill: 0 - fluticasone furoate-vilanterol (BREO ELLIPTA) 100-25 MCG/ACT AEPB; Inhale 1 puff into the lungs daily.  Dispense: 3 each; Refill: 1  5. Chronic deep vein thrombosis (DVT) of left lower extremity, unspecified vein (HCC)  Off medication by choice  6. Essential hypertension  - CBC with Differential/Platelet - Comprehensive metabolic panel - TSH - losartan-hydrochlorothiazide (HYZAAR) 100-12.5 MG tablet; Take 1 tablet by mouth daily.  Dispense: 90 tablet; Refill: 1  7. GERD without esophagitis  No problems at this time  8. Lipid screening  - Lipid panel  9. Other fatigue  - Vitamin B12 - VITAMIN D 25 Hydroxy (Vit-D Deficiency, Fractures)  10. Screening for diabetes mellitus  - Hemoglobin A1c

## 2021-07-14 ENCOUNTER — Ambulatory Visit: Payer: BC Managed Care – PPO | Admitting: Family Medicine

## 2021-07-14 ENCOUNTER — Encounter: Payer: Self-pay | Admitting: Family Medicine

## 2021-07-14 VITALS — BP 128/82 | HR 77 | Temp 98.0°F | Resp 16 | Ht 65.0 in | Wt 178.0 lb

## 2021-07-14 DIAGNOSIS — J4540 Moderate persistent asthma, uncomplicated: Secondary | ICD-10-CM

## 2021-07-14 DIAGNOSIS — Z113 Encounter for screening for infections with a predominantly sexual mode of transmission: Secondary | ICD-10-CM

## 2021-07-14 DIAGNOSIS — I10 Essential (primary) hypertension: Secondary | ICD-10-CM

## 2021-07-14 DIAGNOSIS — Z1211 Encounter for screening for malignant neoplasm of colon: Secondary | ICD-10-CM

## 2021-07-14 DIAGNOSIS — R5383 Other fatigue: Secondary | ICD-10-CM

## 2021-07-14 DIAGNOSIS — I82502 Chronic embolism and thrombosis of unspecified deep veins of left lower extremity: Secondary | ICD-10-CM

## 2021-07-14 DIAGNOSIS — Z131 Encounter for screening for diabetes mellitus: Secondary | ICD-10-CM

## 2021-07-14 DIAGNOSIS — K219 Gastro-esophageal reflux disease without esophagitis: Secondary | ICD-10-CM

## 2021-07-14 DIAGNOSIS — Z13220 Encounter for screening for lipoid disorders: Secondary | ICD-10-CM

## 2021-07-14 DIAGNOSIS — Z124 Encounter for screening for malignant neoplasm of cervix: Secondary | ICD-10-CM | POA: Diagnosis not present

## 2021-07-14 DIAGNOSIS — Z1231 Encounter for screening mammogram for malignant neoplasm of breast: Secondary | ICD-10-CM

## 2021-07-14 MED ORDER — MONTELUKAST SODIUM 10 MG PO TABS
10.0000 mg | ORAL_TABLET | Freq: Every day | ORAL | 1 refills | Status: DC
Start: 2021-07-14 — End: 2022-08-17

## 2021-07-14 MED ORDER — FLUTICASONE FUROATE-VILANTEROL 100-25 MCG/ACT IN AEPB: 1.0000 | INHALATION_SPRAY | Freq: Every day | RESPIRATORY_TRACT | 1 refills | Status: DC

## 2021-07-14 MED ORDER — ALBUTEROL SULFATE HFA 108 (90 BASE) MCG/ACT IN AERS: INHALATION_SPRAY | RESPIRATORY_TRACT | 0 refills | Status: DC

## 2021-07-14 MED ORDER — LOSARTAN POTASSIUM-HCTZ 100-12.5 MG PO TABS
1.0000 | ORAL_TABLET | Freq: Every day | ORAL | 1 refills | Status: DC
Start: 2021-07-14 — End: 2022-02-04

## 2021-07-16 LAB — PAP LB (LIQUID-BASED)

## 2021-07-22 LAB — SPECIMEN STATUS REPORT

## 2021-07-22 LAB — HPV APTIMA: HPV Aptima: NEGATIVE

## 2021-11-17 NOTE — Patient Instructions (Incomplete)

## 2021-11-17 NOTE — Progress Notes (Deleted)
Name: [name] [name]   MRN: [id number]    DOB: [birth year]   Date:11/17/2021       Progress Note  Subjective  Chief Complaint  Annual Exam  HPI  Patient presents for annual CPE.  Diet: *** Exercise: ***   Flowsheet Row Office Visit from 07/14/2021 in [facility]  AUDIT-C Score 0      Depression: Phq 9 is  {Desc; negative/positive:13464}    07/14/2021    2:37 PM 10/02/2020   11:17 AM 04/03/2020   11:05 AM 09/18/2019   10:50 AM 09/05/2019   12:30 PM  Depression screen PHQ 2/9  Decreased Interest 0 1 0 0 0  Down, Depressed, Hopeless 0 1 0 0 0  PHQ - 2 Score 0 2 0 0 0  Altered sleeping 0 3  0 0  Tired, decreased energy 0 1  1 0  Change in appetite 0 0  0 0  Feeling bad or failure about yourself  0 0  0 0  Trouble concentrating 0 0  0 0  Moving slowly or fidgety/restless 0 0  0 0  Suicidal thoughts 0 0  0 0  PHQ-9 Score 0 6  1 0  Difficult doing work/chores    Not difficult at all    Hypertension: BP Readings from Last 3 Encounters:  07/14/21 128/82  10/02/20 132/88  04/03/20 138/88   Obesity: Wt Readings from Last 3 Encounters:  07/14/21 178 lb (80.7 kg)  10/02/20 185 lb 9.6 oz (84.2 kg)  04/03/20 188 lb 4.8 oz (85.4 kg)   BMI Readings from Last 3 Encounters:  07/14/21 29.62 kg/m  10/02/20 30.89 kg/m  04/03/20 31.33 kg/m     Vaccines:   HPV: N/A Tdap: up to date Shingrix: needs Pneumonia: N/A Flu: 2016 COVID-19: up to date   Hep C Screening: 05/03/19 STD testing and prevention (HIV/chl/gon/syphilis): 05/03/19 Intimate partner violence: negative screen  Sexual History : Menstrual History/LMP/Abnormal Bleeding:  Discussed importance of follow up if any post-menopausal bleeding: {Response; yes/no/na:63}  Incontinence Symptoms: {Desc; negative/positive:13464} for symptoms   Breast cancer:  - Last Mammogram: Ordered  07/14/21 - BRCA gene screening: N/A  Osteoporosis Prevention : Discussed high calcium and vitamin D  supplementation, weight bearing exercises Bone density :not applicable   Cervical cancer screening: 07/14/21  Skin cancer: Discussed monitoring for atypical lesions  Colorectal cancer: N/A   Lung cancer:  Low Dose CT Chest recommended if Age 68-80 years, 20 pack-year currently smoking OR have quit w/in 15years. Patient does not qualify for screen   ECG: 09/05/19  Advanced Care Planning: A voluntary discussion about advance care planning including the explanation and discussion of advance directives.  Discussed health care proxy and Living will, and the patient was able to identify a health care proxy as ***.  Patient does not have a living will and power of attorney of health care   Lipids: Lab Results  Component Value Date   CHOL 186 05/03/2019   CHOL 160 07/02/2015   CHOL 154 08/03/2012   Lab Results  Component Value Date   HDL 67 05/03/2019   HDL 78 07/02/2015   HDL 51 08/03/2012   Lab Results  Component Value Date   LDLCALC 109 (H) 05/03/2019   LDLCALC 73 07/02/2015   LDLCALC 92 08/03/2012   Lab Results  Component Value Date   TRIG 52 05/03/2019   TRIG 43 07/02/2015   TRIG 53 08/03/2012   Lab Results  Component Value Date  CHOLHDL 2.8 05/03/2019   CHOLHDL 2.1 07/02/2015   No results found for: LDLDIRECT  Glucose: Glucose  Date Value Ref Range Status  05/03/2019 88 65 - 99 mg/dL Final  07/02/2015 89 65 - 99 mg/dL Final   Glucose, Bld  Date Value Ref Range Status  09/11/2017 87 65 - 99 mg/dL Final    Patient Active Problem List   Diagnosis Date Noted   Chronic deep vein thrombosis (DVT) of left lower extremity (HCC) 05/03/2019   Hypertension 09/11/2017   Adhesive capsulitis of right shoulder 05/12/2017   Perennial allergic rhinitis with seasonal variation 07/02/2015   Hypokalemia 07/02/2015    Past Surgical History:  Procedure Laterality Date   INSERTION OF VENA CAVA FILTER  04/2014   Dr. Lucky Cowboy    IVC FILTER REMOVAL N/A 12/29/2016   Procedure: IVC  Filter Removal;  Surgeon: Algernon Huxley, MD;  Location: [place] [facility];  Service: Cardiovascular;  Laterality: N/A;   PERIPHERAL VASCULAR THROMBECTOMY Left 11/01/2016   Procedure: Peripheral Vascular Thrombectomy;  Surgeon: Algernon Huxley, MD;  Location: [place] [facility];  Service: Cardiovascular;  Laterality: Left;   TUBAL LIGATION      Family History  Problem Relation Age of Onset   Diabetes Mother    Hypertension Mother    Hypercalcemia Father    Leukemia Son 3   Diabetes Maternal Grandmother    Heart disease Paternal Grandfather     Social History   Socioeconomic History   Marital status: Single    Spouse name: Not on file   Number of children: 3   Years of education: Not on file   Highest education level: Associate degree: occupational, Hotel manager, or vocational program  Occupational History   Not on file  Tobacco Use   Smoking status: Former    Packs/day: 0.50    Years: 10.00    Pack years: 5.00    Types: Cigarettes    Start date: 08/01/1998    Quit date: 08/01/2008    Years since quitting: 13.3   Smokeless tobacco: Never  Vaping Use   Vaping Use: Never used  Substance and Sexual Activity   Alcohol use: Not Currently    Alcohol/week: 0.0 standard drinks    Comment: many years ago   Drug use: Never   Sexual activity: Not Currently  Other Topics Concern   Not on file  Social History Narrative   Not on file   Social Determinants of Health   Financial Resource Strain: Low Risk    Difficulty of Paying Living Expenses: Not hard at all  Food Insecurity: No Food Insecurity   Worried About Charity fundraiser in the Last Year: Never true   [place] in the Last Year: Never true  Transportation Needs: No Transportation Needs   Lack of Transportation (Medical): No   Lack of Transportation (Non-Medical): No  Physical Activity: Insufficiently Active   Days of Exercise per Week: 2 days   Minutes of Exercise per Session: 40 min  Stress: No Stress Concern  Present   Feeling of Stress : Not at all  Social Connections: Moderately Integrated   Frequency of Communication with Friends and Family: More than three times a week   Frequency of Social Gatherings with Friends and Family: More than three times a week   Attends Religious Services: More than 4 times per year   Active Member of [employer] or Organizations: Yes   Attends Archivist Meetings: More than 4 times  per year   Marital Status: Divorced  Human resources officer Violence: Not At Risk   Fear of Current or Ex-Partner: No   Emotionally Abused: No   Physically Abused: No   Sexually Abused: No     Current Outpatient Medications:    acetaminophen (TYLENOL) 500 MG tablet, Take 1,000 mg by mouth 2 (two) times daily as needed for moderate pain., Disp: , Rfl:    albuterol (VENTOLIN HFA) 108 (90 Base) MCG/ACT inhaler, INHALE 2 PUFFS BY MOUTH EVERY 6 HOURS AS NEEDED FOR WHEEZING OR SHORTNESS OF BREATH, Disp: 25.5 g, Rfl: 0   fluticasone furoate-vilanterol (BREO ELLIPTA) 100-25 MCG/ACT AEPB, Inhale 1 puff into the lungs daily., Disp: 3 each, Rfl: 1   loratadine (CLARITIN) 10 MG tablet, Take 1 tablet (10 mg total) by mouth daily., Disp: 30 tablet, Rfl: 5   losartan-hydrochlorothiazide (HYZAAR) 100-12.5 MG tablet, Take 1 tablet by mouth daily., Disp: 90 tablet, Rfl: 1   montelukast (SINGULAIR) 10 MG tablet, Take 1 tablet (10 mg total) by mouth at bedtime., Disp: 90 tablet, Rfl: 1   Multiple Vitamin (MULTIVITAMIN WITH MINERALS) TABS tablet, Take 1 tablet by mouth daily., Disp: , Rfl:   No Known Allergies   ROS  ***  Objective  There were no vitals filed for this visit.  There is no height or weight on file to calculate BMI.  Physical Exam ***  No results found for this or any previous visit (from the past 2160 hour(s)).   Fall Risk:    07/14/2021    2:37 PM 10/02/2020   11:17 AM 04/03/2020   11:04 AM 09/18/2019   10:50 AM 09/05/2019   12:30 PM  Fall Risk   Falls in the past year? 0  0 0 0 0  Number falls in past yr:  0 0 0 0  Injury with Fall?  0 0 0 0  Follow up Falls prevention discussed         Functional Status Survey:     Assessment & Plan  1. Well adult exam ***   -USPSTF grade A and B recommendations reviewed with patient; age-appropriate recommendations, preventive care, screening tests, etc discussed and encouraged; healthy living encouraged; see AVS for patient education given to patient -Discussed importance of 150 minutes of physical activity weekly, eat two servings of fish weekly, eat one serving of tree nuts ( cashews, pistachios, pecans, almonds.Marland Kitchen) every other day, eat 6 servings of fruit/vegetables daily and drink plenty of water and avoid sweet beverages.   -Reviewed Health Maintenance: {yes AX:[id number]}

## 2021-11-18 ENCOUNTER — Encounter: Payer: BC Managed Care – PPO | Admitting: Family Medicine

## 2021-11-18 DIAGNOSIS — Z0000 Encounter for general adult medical examination without abnormal findings: Secondary | ICD-10-CM

## 2021-11-18 DIAGNOSIS — Z1211 Encounter for screening for malignant neoplasm of colon: Secondary | ICD-10-CM

## 2021-12-23 LAB — CBC WITH DIFFERENTIAL/PLATELET
Basophils Absolute: 0.1 10*3/uL (ref 0.0–0.2)
Basos: 2 %
EOS (ABSOLUTE): 0.1 10*3/uL (ref 0.0–0.4)
Eos: 2 %
Hematocrit: 39.1 % (ref 34.0–46.6)
Hemoglobin: 13.1 g/dL (ref 11.1–15.9)
Immature Grans (Abs): 0 10*3/uL (ref 0.0–0.1)
Immature Granulocytes: 0 %
Lymphocytes Absolute: 2.5 10*3/uL (ref 0.7–3.1)
Lymphs: 40 %
MCH: 29.2 pg (ref 26.6–33.0)
MCHC: 33.5 g/dL (ref 31.5–35.7)
MCV: 87 fL (ref 79–97)
Monocytes Absolute: 0.6 10*3/uL (ref 0.1–0.9)
Monocytes: 10 %
Neutrophils Absolute: 2.9 10*3/uL (ref 1.4–7.0)
Neutrophils: 46 %
Platelets: 241 10*3/uL (ref 150–450)
RBC: 4.48 x10E6/uL (ref 3.77–5.28)
RDW: 12.6 % (ref 11.7–15.4)
WBC: 6.2 10*3/uL (ref 3.4–10.8)

## 2021-12-23 LAB — COMPREHENSIVE METABOLIC PANEL
ALT: 13 IU/L (ref 0–32)
AST: 21 IU/L (ref 0–40)
Albumin/Globulin Ratio: 1.7 (ref 1.2–2.2)
Albumin: 4.5 g/dL (ref 3.8–4.9)
Alkaline Phosphatase: 58 IU/L (ref 44–121)
BUN/Creatinine Ratio: 15 (ref 9–23)
BUN: 13 mg/dL (ref 6–24)
Bilirubin Total: 0.5 mg/dL (ref 0.0–1.2)
CO2: 26 mmol/L (ref 20–29)
Calcium: 9.8 mg/dL (ref 8.7–10.2)
Chloride: 101 mmol/L (ref 96–106)
Creatinine, Ser: 0.88 mg/dL (ref 0.57–1.00)
Globulin, Total: 2.6 g/dL (ref 1.5–4.5)
Glucose: 91 mg/dL (ref 70–99)
Potassium: 4 mmol/L (ref 3.5–5.2)
Sodium: 139 mmol/L (ref 134–144)
Total Protein: 7.1 g/dL (ref 6.0–8.5)
eGFR: 78 mL/min/{1.73_m2} (ref >59)

## 2021-12-23 LAB — LIPID PANEL
Chol/HDL Ratio: 2.3 ratio (ref 0.0–4.4)
Cholesterol, Total: 188 mg/dL (ref 100–199)
HDL: 81 mg/dL (ref >39)
LDL Chol Calc (NIH): 97 mg/dL (ref 0–99)
Triglycerides: 50 mg/dL (ref 0–149)
VLDL Cholesterol Cal: 10 mg/dL (ref 5–40)

## 2021-12-23 LAB — TSH: TSH: 1.34 u[IU]/mL (ref 0.450–4.500)

## 2021-12-23 LAB — VITAMIN B12: Vitamin B-12: 457 pg/mL (ref 232–1245)

## 2021-12-23 LAB — VITAMIN D 25 HYDROXY (VIT D DEFICIENCY, FRACTURES): Vit D, 25-Hydroxy: 39.7 ng/mL (ref 30.0–100.0)

## 2021-12-23 LAB — HEMOGLOBIN A1C
Est. average glucose Bld gHb Est-mCnc: 105 mg/dL
Hgb A1c MFr Bld: 5.3 % (ref 4.8–5.6)

## 2022-01-14 ENCOUNTER — Ambulatory Visit: Payer: BC Managed Care – PPO | Admitting: Family Medicine

## 2022-02-03 ENCOUNTER — Other Ambulatory Visit: Payer: Self-pay | Admitting: Family Medicine

## 2022-02-03 DIAGNOSIS — I10 Essential (primary) hypertension: Secondary | ICD-10-CM

## 2022-02-04 ENCOUNTER — Other Ambulatory Visit: Payer: Self-pay | Admitting: Family Medicine

## 2022-02-04 DIAGNOSIS — J4540 Moderate persistent asthma, uncomplicated: Secondary | ICD-10-CM

## 2022-03-08 NOTE — Patient Instructions (Signed)
Preventive Care 40-55 Years Old, Female Preventive care refers to lifestyle choices and visits with your health care provider that can promote health and wellness. Preventive care visits are also called wellness exams. What can I expect for my preventive care visit? Counseling Your health care provider may ask you questions about your: Medical history, including: Past medical problems. Family medical history. Pregnancy history. Current health, including: Menstrual cycle. Method of birth control. Emotional well-being. Home life and relationship well-being. Sexual activity and sexual health. Lifestyle, including: Alcohol, nicotine or tobacco, and drug use. Access to firearms. Diet, exercise, and sleep habits. Work and work environment. Sunscreen use. Safety issues such as seatbelt and bike helmet use. Physical exam Your health care provider will check your: Height and weight. These may be used to calculate your BMI (body mass index). BMI is a measurement that tells if you are at a healthy weight. Waist circumference. This measures the distance around your waistline. This measurement also tells if you are at a healthy weight and may help predict your risk of certain diseases, such as type 2 diabetes and high blood pressure. Heart rate and blood pressure. Body temperature. Skin for abnormal spots. What immunizations do I need?  Vaccines are usually given at various ages, according to a schedule. Your health care provider will recommend vaccines for you based on your age, medical history, and lifestyle or other factors, such as travel or where you work. What tests do I need? Screening Your health care provider may recommend screening tests for certain conditions. This may include: Lipid and cholesterol levels. Diabetes screening. This is done by checking your blood sugar (glucose) after you have not eaten for a while (fasting). Pelvic exam and Pap test. Hepatitis B test. Hepatitis C  test. HIV (human immunodeficiency virus) test. STI (sexually transmitted infection) testing, if you are at risk. Lung cancer screening. Colorectal cancer screening. Mammogram. Talk with your health care provider about when you should start having regular mammograms. This may depend on whether you have a family history of breast cancer. BRCA-related cancer screening. This may be done if you have a family history of breast, ovarian, tubal, or peritoneal cancers. Bone density scan. This is done to screen for osteoporosis. Talk with your health care provider about your test results, treatment options, and if necessary, the need for more tests. Follow these instructions at home: Eating and drinking  Eat a diet that includes fresh fruits and vegetables, whole grains, lean protein, and low-fat dairy products. Take vitamin and mineral supplements as recommended by your health care provider. Do not drink alcohol if: Your health care provider tells you not to drink. You are pregnant, may be pregnant, or are planning to become pregnant. If you drink alcohol: Limit how much you have to 0-1 drink a day. Know how much alcohol is in your drink. In the [place] one drink equals one 12 oz bottle of beer (355 mL), one 5 oz glass of wine (148 mL), or one 1 oz glass of hard liquor (44 mL). Lifestyle Brush your teeth every morning and night with fluoride toothpaste. Floss one time each day. Exercise for at least 30 minutes 5 or more days each week. Do not use any products that contain nicotine or tobacco. These products include cigarettes, chewing tobacco, and vaping devices, such as e-cigarettes. If you need help quitting, ask your health care provider. Do not use drugs. If you are sexually active, practice safe sex. Use a condom or other form of protection to   prevent STIs. If you do not wish to become pregnant, use a form of birth control. If you plan to become pregnant, see your health care provider for a  prepregnancy visit. Take aspirin only as told by your health care provider. Make sure that you understand how much to take and what form to take. Work with your health care provider to find out whether it is safe and beneficial for you to take aspirin daily. Find healthy ways to manage stress, such as: Meditation, yoga, or listening to music. Journaling. Talking to a trusted person. Spending time with friends and family. Minimize exposure to UV radiation to reduce your risk of skin cancer. Safety Always wear your seat belt while driving or riding in a vehicle. Do not drive: If you have been drinking alcohol. Do not ride with someone who has been drinking. When you are tired or distracted. While texting. If you have been using any mind-altering substances or drugs. Wear a helmet and other protective equipment during sports activities. If you have firearms in your house, make sure you follow all gun safety procedures. Seek help if you have been physically or sexually abused. What's next? Visit your health care provider once a year for an annual wellness visit. Ask your health care provider how often you should have your eyes and teeth checked. Stay up to date on all vaccines. This information is not intended to replace advice given to you by your health care provider. Make sure you discuss any questions you have with your health care provider. Document Revised: 01/13/2021 Document Reviewed: 01/13/2021 Elsevier Patient Education  [place].

## 2022-03-08 NOTE — Progress Notes (Unsigned)
Name: [name] [name]   MRN: [id number]    DOB: 1966-06-25   Date:03/09/2022       Progress Note  Subjective  Chief Complaint  Annual Exam  HPI  Patient presents for annual CPE.  Diet: eating  a lot of salads and lean meat, she skips meals and snacks on yogurt with granola. , but she drinks sweet tea Exercise: needs to increase to 150 minutes per week    [place] Office Visit from 07/14/2021 in [facility]  AUDIT-C Score 0      Depression: Phq 9 is  negative    03/09/2022    2:06 PM 07/14/2021    2:37 PM 10/02/2020   11:17 AM 04/03/2020   11:05 AM 09/18/2019   10:50 AM  Depression screen PHQ 2/9  Decreased Interest 0 0 1 0 0  Down, Depressed, Hopeless 0 0 1 0 0  PHQ - 2 Score 0 0 2 0 0  Altered sleeping 0 0 3  0  Tired, decreased energy 1 0 1  1  Change in appetite 0 0 0  0  Feeling bad or failure about yourself  0 0 0  0  Trouble concentrating 0 0 0  0  Moving slowly or fidgety/restless 0 0 0  0  Suicidal thoughts 0 0 0  0  PHQ-9 Score 1 0 6  1  Difficult doing work/chores Not difficult at all    Not difficult at all   Hypertension: BP Readings from Last 3 Encounters:  03/09/22 130/80  07/14/21 128/82  10/02/20 132/88   Obesity: Wt Readings from Last 3 Encounters:  03/09/22 187 lb 9.6 oz (85.1 kg)  07/14/21 178 lb (80.7 kg)  10/02/20 185 lb 9.6 oz (84.2 kg)   BMI Readings from Last 3 Encounters:  03/09/22 31.22 kg/m  07/14/21 29.62 kg/m  10/02/20 30.89 kg/m     Vaccines:   HPV: N/A Tdap: up to date Shingrix: refuses Pneumonia: refuses  Flu: 2016, refuses  COVID-19: discussed bivalent    Hep C Screening: 05/03/19 STD testing and prevention (HIV/chl/gon/syphilis): 05/03/19 Intimate partner violence: negative screen  Sexual History : not sexually active in 15 years  Menstrual History/LMP/Abnormal Bleeding: peri-menopause , skipping cycles, had one in March and again 6 days ago  Discussed importance of follow up if any  post-menopausal bleeding: not applicable  Incontinence Symptoms: negative for symptoms   Breast cancer:  - Last Mammogram: Ordered 07/14/21 - BRCA gene screening: N/A  Osteoporosis Prevention : Discussed high calcium and vitamin D supplementation, weight bearing exercises Bone density :not applicable   Cervical cancer screening: 07/14/21  Skin cancer: Discussed monitoring for atypical lesions  Colorectal cancer: N/A   Lung cancer:  Low Dose CT Chest recommended if Age 88-80 years, 20 pack-year currently smoking OR have quit w/in 15years. Patient does not qualify for screen   ECG: 09/05/19  Advanced Care Planning: A voluntary discussion about advance care planning including the explanation and discussion of advance directives.  Discussed health care proxy and Living will, and the patient was able to identify a health care proxy as son Chrissie Noa.  Patient does not have a living will and power of attorney of health care   Lipids: Lab Results  Component Value Date   CHOL 188 12/22/2021   CHOL 186 05/03/2019   CHOL 160 07/02/2015   Lab Results  Component Value Date   HDL 81 12/22/2021   HDL 67 05/03/2019   HDL 78 07/02/2015  Lab Results  Component Value Date   LDLCALC 97 12/22/2021   LDLCALC 109 (H) 05/03/2019   LDLCALC 73 07/02/2015   Lab Results  Component Value Date   TRIG 50 12/22/2021   TRIG 52 05/03/2019   TRIG 43 07/02/2015   Lab Results  Component Value Date   CHOLHDL 2.3 12/22/2021   CHOLHDL 2.8 05/03/2019   CHOLHDL 2.1 07/02/2015   No results found for: "LDLDIRECT"  Glucose: Glucose  Date Value Ref Range Status  12/22/2021 91 70 - 99 mg/dL Final  05/03/2019 88 65 - 99 mg/dL Final  07/02/2015 89 65 - 99 mg/dL Final   Glucose, Bld  Date Value Ref Range Status  09/11/2017 87 65 - 99 mg/dL Final    Patient Active Problem List   Diagnosis Date Noted   Chronic deep vein thrombosis (DVT) of left lower extremity ([place]) 05/03/2019   Hypertension 09/11/2017    Adhesive capsulitis of right shoulder 05/12/2017   Perennial allergic rhinitis with seasonal variation 07/02/2015   Hypokalemia 07/02/2015    Past Surgical History:  Procedure Laterality Date   INSERTION OF VENA CAVA FILTER  04/2014   Dr. Lucky Cowboy    IVC FILTER REMOVAL N/A 12/29/2016   Procedure: IVC Filter Removal;  Surgeon: Algernon Huxley, MD;  Location: [place] [facility];  Service: Cardiovascular;  Laterality: N/A;   PERIPHERAL VASCULAR THROMBECTOMY Left 11/01/2016   Procedure: Peripheral Vascular Thrombectomy;  Surgeon: Algernon Huxley, MD;  Location: [place] [facility];  Service: Cardiovascular;  Laterality: Left;   TUBAL LIGATION      Family History  Problem Relation Age of Onset   Diabetes Mother    Hypertension Mother    Hypercalcemia Father    Leukemia Son 3   Diabetes Maternal Grandmother    Heart disease Paternal Grandfather     Social History   Socioeconomic History   Marital status: Single    Spouse name: Not on file   Number of children: 3   Years of education: Not on file   Highest education level: Associate degree: occupational, Hotel manager, or vocational program  Occupational History   Not on file  Tobacco Use   Smoking status: Former    Packs/day: 0.50    Years: 10.00    Total pack years: 5.00    Types: Cigarettes    Start date: 08/01/1998    Quit date: 08/01/2008    Years since quitting: 13.6   Smokeless tobacco: Never  Vaping Use   Vaping Use: Never used  Substance and Sexual Activity   Alcohol use: Not Currently    Alcohol/week: 0.0 standard drinks of alcohol    Comment: many years ago   Drug use: Never   Sexual activity: Not Currently  Other Topics Concern   Not on file  Social History Narrative   Not on file   Social Determinants of Health   Financial Resource Strain: Low Risk  (03/09/2022)   Overall Financial Resource Strain (CARDIA)    Difficulty of Paying Living Expenses: Not hard at all  Food Insecurity: No Food Insecurity (03/09/2022)    Hunger Vital Sign    Worried About Running Out of Food in the Last Year: Never true    Landfall in the Last Year: Never true  Transportation Needs: No Transportation Needs (03/09/2022)   PRAPARE - Hydrologist (Medical): No    Lack of Transportation (Non-Medical): No  Physical Activity: Insufficiently Active (03/09/2022)   Exercise  Vital Sign    Days of Exercise per Week: 2 days    Minutes of Exercise per Session: 20 min  Stress: No Stress Concern Present (03/09/2022)   [place]    Feeling of Stress : Only a little  Social Connections: Moderately Integrated (03/09/2022)   Social Connection and Isolation Panel [NHANES]    Frequency of Communication with Friends and Family: More than three times a week    Frequency of Social Gatherings with Friends and Family: Twice a week    Attends Religious Services: More than 4 times per year    Active Member of [employer] or Organizations: Yes    Attends Music therapist: More than 4 times per year    Marital Status: Divorced  Intimate Partner Violence: Not At Risk (03/09/2022)   Humiliation, Afraid, Rape, and Kick questionnaire    Fear of Current or Ex-Partner: No    Emotionally Abused: No    Physically Abused: No    Sexually Abused: No     Current Outpatient Medications:    acetaminophen (TYLENOL) 500 MG tablet, Take 1,000 mg by mouth 2 (two) times daily as needed for moderate pain., Disp: , Rfl:    albuterol (VENTOLIN HFA) 108 (90 Base) MCG/ACT inhaler, INHALE 2 PUFFS BY MOUTH EVERY 6 HOURS AS NEEDED FOR WHEEZING OR SHORTNESS OF BREATH, Disp: 25.5 g, Rfl: 0   fluticasone furoate-vilanterol (BREO ELLIPTA) 100-25 MCG/ACT AEPB, Inhale 1 puff into the lungs daily., Disp: 3 each, Rfl: 1   loratadine (CLARITIN) 10 MG tablet, Take 1 tablet (10 mg total) by mouth daily., Disp: 30 tablet, Rfl: 5   losartan-hydrochlorothiazide (HYZAAR) 100-12.5 MG  tablet, TAKE 1 TABLET BY MOUTH DAILY, Disp: 90 tablet, Rfl: 0   montelukast (SINGULAIR) 10 MG tablet, Take 1 tablet (10 mg total) by mouth at bedtime., Disp: 90 tablet, Rfl: 1   Multiple Vitamin (MULTIVITAMIN WITH MINERALS) TABS tablet, Take 1 tablet by mouth daily., Disp: , Rfl:   No Known Allergies   ROS  Constitutional: Negative for fever, positive for  weight change.  Respiratory: Negative for cough and shortness of breath.   Cardiovascular: Negative for chest pain or palpitations.  Gastrointestinal: Negative for abdominal pain, no bowel changes.  Musculoskeletal: Negative for gait problem or joint swelling.  Skin: Negative for rash.  Neurological: Negative for dizziness or headache.  No other specific complaints in a complete review of systems (except as listed in HPI above).   Objective  Vitals:   03/09/22 1335  BP: 130/80  Pulse: 69  Resp: 16  Temp: 98.3 F (36.8 C)  TempSrc: Oral  SpO2: 99%  Weight: 187 lb 9.6 oz (85.1 kg)  Height: 5' 5"  (1.651 m)    Body mass index is 31.22 kg/m.  Physical Exam  Constitutional: Patient appears well-developed and well-nourished. No distress.  HENT: Head: Normocephalic and atraumatic. Ears: B TMs ok, no erythema or effusion; Nose: Nose normal. Mouth/Throat: Oropharynx is clear and moist. No oropharyngeal exudate.  Eyes: Conjunctivae and EOM are normal. Pupils are equal, round, and reactive to light. No scleral icterus.  Neck: Normal range of motion. Neck supple. No JVD present. No thyromegaly present.  Cardiovascular: Normal rate, regular rhythm and normal heart sounds.  No murmur heard. No BLE edema. Pulmonary/Chest: Effort normal and breath sounds normal. No respiratory distress. Abdominal: Soft. Bowel sounds are normal, no distension. There is no tenderness. no masses Breast: no lumps or masses, no nipple discharge or  rashes FEMALE GENITALIA:  Not done  RECTAL: not done  Musculoskeletal: Normal range of motion, no joint  effusions. No gross deformities Neurological: he is alert and oriented to person, place, and time. No cranial nerve deficit. Coordination, balance, strength, speech and gait are normal.  Skin: Skin is warm and dry. No rash noted. No erythema.  Psychiatric: Patient has a normal mood and affect. behavior is normal. Judgment and thought content normal.   Recent Results (from the past 2160 hour(s))  Lipid panel     Status: None   Collection Time: 12/22/21  2:04 PM  Result Value Ref Range   Cholesterol, Total 188 100 - 199 mg/dL   Triglycerides 50 0 - 149 mg/dL   HDL 81 >39 mg/dL   VLDL Cholesterol Cal 10 5 - 40 mg/dL   LDL Chol Calc (NIH) 97 0 - 99 mg/dL   Chol/HDL Ratio 2.3 0.0 - 4.4 ratio    Comment:                                   T. Chol/HDL Ratio                                             Men  Women                               1/2 Avg.Risk  3.4    3.3                                   Avg.Risk  5.0    4.4                                2X Avg.Risk  9.6    7.1                                3X Avg.Risk 23.4   11.0   Hemoglobin A1c     Status: None   Collection Time: 12/22/21  2:04 PM  Result Value Ref Range   Hgb A1c MFr Bld 5.3 4.8 - 5.6 %    Comment:          Prediabetes: 5.7 - 6.4          Diabetes: >6.4          Glycemic control for adults with diabetes: <7.0    Est. average glucose Bld gHb Est-mCnc 105 mg/dL  CBC with Differential/Platelet     Status: None   Collection Time: 12/22/21  2:04 PM  Result Value Ref Range   WBC 6.2 3.4 - 10.8 x10E3/uL   RBC 4.48 3.77 - 5.28 x10E6/uL   Hemoglobin 13.1 11.1 - 15.9 g/dL   Hematocrit 39.1 34.0 - 46.6 %   MCV 87 79 - 97 fL   MCH 29.2 26.6 - 33.0 pg   MCHC 33.5 31.5 - 35.7 g/dL   RDW 12.6 11.7 - 15.4 %   Platelets 241 150 - 450 x10E3/uL   Neutrophils 46 Not Estab. %   Lymphs 40 Not Estab. %  Monocytes 10 Not Estab. %   Eos 2 Not Estab. %   Basos 2 Not Estab. %   Neutrophils Absolute 2.9 1.4 - 7.0 x10E3/uL   Lymphocytes  Absolute 2.5 0.7 - 3.1 x10E3/uL   Monocytes Absolute 0.6 0.1 - 0.9 x10E3/uL   EOS (ABSOLUTE) 0.1 0.0 - 0.4 x10E3/uL   Basophils Absolute 0.1 0.0 - 0.2 x10E3/uL   Immature Granulocytes 0 Not Estab. %   Immature Grans (Abs) 0.0 0.0 - 0.1 x10E3/uL  Comprehensive metabolic panel     Status: None   Collection Time: 12/22/21  2:04 PM  Result Value Ref Range   Glucose 91 70 - 99 mg/dL   BUN 13 6 - 24 mg/dL   Creatinine, Ser 0.88 0.57 - 1.00 mg/dL   eGFR 78 >59 mL/min/1.73   BUN/Creatinine Ratio 15 9 - 23   Sodium 139 134 - 144 mmol/L   Potassium 4.0 3.5 - 5.2 mmol/L   Chloride 101 96 - 106 mmol/L   CO2 26 20 - 29 mmol/L   Calcium 9.8 8.7 - 10.2 mg/dL   Total Protein 7.1 6.0 - 8.5 g/dL   Albumin 4.5 3.8 - 4.9 g/dL   Globulin, Total 2.6 1.5 - 4.5 g/dL   Albumin/Globulin Ratio 1.7 1.2 - 2.2   Bilirubin Total 0.5 0.0 - 1.2 mg/dL   Alkaline Phosphatase 58 44 - 121 IU/L   AST 21 0 - 40 IU/L   ALT 13 0 - 32 IU/L  TSH     Status: None   Collection Time: 12/22/21  2:04 PM  Result Value Ref Range   TSH 1.340 0.450 - 4.500 uIU/mL  Vitamin B12     Status: None   Collection Time: 12/22/21  2:04 PM  Result Value Ref Range   Vitamin B-12 457 232 - 1,245 pg/mL  VITAMIN D 25 Hydroxy (Vit-D Deficiency, Fractures)     Status: None   Collection Time: 12/22/21  2:04 PM  Result Value Ref Range   Vit D, 25-Hydroxy 39.7 30.0 - 100.0 ng/mL    Comment: Vitamin D deficiency has been defined by the Institute of Medicine and an Endocrine Society practice guideline as a level of serum 25-OH vitamin D less than 20 ng/mL (1,2). The Endocrine Society went on to further define vitamin D insufficiency as a level between 21 and 29 ng/mL (2). 1. IOM (Institute of Medicine). 2010. Dietary reference    intakes for calcium and D. [place]: The    [employer]. 2. Holick MF, Binkley , Bischoff-Ferrari HA, et al.    Evaluation, treatment, and prevention of vitamin D    deficiency: an Endocrine  Society clinical practice    guideline. JCEM. 2011 Jul; 96(7):1911-30.      Fall Risk:    03/09/2022    1:37 PM 07/14/2021    2:37 PM 10/02/2020   11:17 AM 04/03/2020   11:04 AM 09/18/2019   10:50 AM  Fall Risk   Falls in the past year? 0 0 0 0 0  Number falls in past yr:   0 0 0  Injury with Fall?   0 0 0  Risk for fall due to : No Fall Risks      Follow up Falls prevention discussed;Education provided;Falls evaluation completed Falls prevention discussed        Functional Status Survey: Is the patient deaf or have difficulty hearing?: No Does the patient have difficulty seeing, even when wearing glasses/contacts?: No Does the patient have difficulty concentrating,  remembering, or making decisions?: No Does the patient have difficulty walking or climbing stairs?: No Does the patient have difficulty dressing or bathing?: No Does the patient have difficulty doing errands alone such as visiting a doctor's office or shopping?: No   Assessment & Plan  1. Well adult exam  She has fatigue, drinking a lot of caffeine, discussed cutting down on caffeine   2. Colon cancer screening  - Fecal occult blood, imunochemical  3. Need for pneumococcal 20-valent conjugate vaccination  Refused   4. Need for Tdap vaccination  refused  5. Needs flu shot  Refused  6. Need for shingles vaccine  Refused    -USPSTF grade A and B recommendations reviewed with patient; age-appropriate recommendations, preventive care, screening tests, etc discussed and encouraged; healthy living encouraged; see AVS for patient education given to patient -Discussed importance of 150 minutes of physical activity weekly, eat two servings of fish weekly, eat one serving of tree nuts ( cashews, pistachios, pecans, almonds.Marland Kitchen) every other day, eat 6 servings of fruit/vegetables daily and drink plenty of water and avoid sweet beverages.   -Reviewed Health Maintenance: Yes.

## 2022-03-09 ENCOUNTER — Encounter: Payer: Self-pay | Admitting: Family Medicine

## 2022-03-09 ENCOUNTER — Ambulatory Visit (INDEPENDENT_AMBULATORY_CARE_PROVIDER_SITE_OTHER): Payer: BC Managed Care – PPO | Admitting: Family Medicine

## 2022-03-09 VITALS — BP 130/80 | HR 69 | Temp 98.3°F | Resp 16 | Ht 65.0 in | Wt 187.6 lb

## 2022-03-09 DIAGNOSIS — Z1211 Encounter for screening for malignant neoplasm of colon: Secondary | ICD-10-CM | POA: Diagnosis not present

## 2022-03-09 DIAGNOSIS — Z23 Encounter for immunization: Secondary | ICD-10-CM

## 2022-03-09 DIAGNOSIS — Z1231 Encounter for screening mammogram for malignant neoplasm of breast: Secondary | ICD-10-CM

## 2022-03-09 DIAGNOSIS — Z0000 Encounter for general adult medical examination without abnormal findings: Secondary | ICD-10-CM | POA: Diagnosis not present

## 2022-04-26 NOTE — Progress Notes (Deleted)
Name: [name] [name]   MRN: [id number]    DOB: [birth year]   Date:04/26/2022       Progress Note  Subjective  Chief Complaint  Follow Up  HPI  Asthma: she is doing well Breo and prn albuterol, no recent flares of asthma, denies cough, wheezing or SOB. She uses rescue inhaler very seldom . Did not have a flare this Fall    HTN: she stopped taking norvasc because her bp was dropped. She is compliant with losartan hctz, denies side effects of medication. Denies chest pain or palpitation    Chronic DVT: she was advised to stay on Xarelto for life, but stopped taking medication on her own at the end of 2020 .  She was seen by Dr. Wyn Quaker and hematologist. She has a  history of IVF placed and removed in 2018  and also had a stent placed because of recurrent DVT. Last episode was 2017, took 3 years of Xarelto, stopped on her own but has been taking aspirin 81 mg daily since .She denies side effects, she is aware of risk of recurrence. No pain or swelling on her legs   Obesity: she had gained almost 20 lbs from 2018 to 2022, less active since the pandemic, but lost weight since last visit, down 7 lbs. She has a puppy and will try to be more active   AR: doing well at this time, denies rhinorrhea, nasal congestion   GERD: doing well, off medication and symptoms controlled   Patient Active Problem List   Diagnosis Date Noted   Chronic deep vein thrombosis (DVT) of left lower extremity ([facility]) 05/03/2019   Hypertension 09/11/2017   Adhesive capsulitis of right shoulder 05/12/2017   Perennial allergic rhinitis with seasonal variation 07/02/2015   Hypokalemia 07/02/2015    Past Surgical History:  Procedure Laterality Date   INSERTION OF VENA CAVA FILTER  04/2014   Dr. Wyn Quaker    IVC FILTER REMOVAL N/A 12/29/2016   Procedure: IVC Filter Removal;  Surgeon: Annice Needy, MD;  Location: [facility];  Service: Cardiovascular;  Laterality: N/A;   PERIPHERAL VASCULAR THROMBECTOMY Left 11/01/2016    Procedure: Peripheral Vascular Thrombectomy;  Surgeon: Annice Needy, MD;  Location: [facility];  Service: Cardiovascular;  Laterality: Left;   TUBAL LIGATION      Family History  Problem Relation Age of Onset   Diabetes Mother    Hypertension Mother    Hypercalcemia Father    Leukemia Son 3   Diabetes Maternal Grandmother    Heart disease Paternal Grandfather     Social History   Tobacco Use   Smoking status: Former    Packs/day: 0.50    Years: 10.00    Total pack years: 5.00    Types: Cigarettes    Start date: 08/01/1998    Quit date: 08/01/2008    Years since quitting: 13.7   Smokeless tobacco: Never  Substance Use Topics   Alcohol use: Not Currently    Alcohol/week: 0.0 standard drinks of alcohol    Comment: many years ago     Current Outpatient Medications:    acetaminophen (TYLENOL) 500 MG tablet, Take 1,000 mg by mouth 2 (two) times daily as needed for moderate pain., Disp: , Rfl:    albuterol (VENTOLIN HFA) 108 (90 Base) MCG/ACT inhaler, INHALE 2 PUFFS BY MOUTH EVERY 6 HOURS AS NEEDED FOR WHEEZING OR SHORTNESS OF BREATH, Disp: 25.5 g, Rfl: 0   fluticasone furoate-vilanterol (BREO ELLIPTA) 100-25 MCG/ACT  AEPB, Inhale 1 puff into the lungs daily., Disp: 3 each, Rfl: 1   loratadine (CLARITIN) 10 MG tablet, Take 1 tablet (10 mg total) by mouth daily., Disp: 30 tablet, Rfl: 5   losartan-hydrochlorothiazide (HYZAAR) 100-12.5 MG tablet, TAKE 1 TABLET BY MOUTH DAILY, Disp: 90 tablet, Rfl: 0   montelukast (SINGULAIR) 10 MG tablet, Take 1 tablet (10 mg total) by mouth at bedtime., Disp: 90 tablet, Rfl: 1   Multiple Vitamin (MULTIVITAMIN WITH MINERALS) TABS tablet, Take 1 tablet by mouth daily., Disp: , Rfl:   No Known Allergies  I personally reviewed active problem list, medication list, allergies, family history, social history, health maintenance with the patient/caregiver today.   ROS  ***  Objective  There were no vitals filed for this visit.  There is  no height or weight on file to calculate BMI.  Physical Exam ***  No results found for this or any previous visit (from the past 2160 hour(s)).   PHQ2/9:    03/09/2022    2:06 PM 07/14/2021    2:37 PM 10/02/2020   11:17 AM 04/03/2020   11:05 AM 09/18/2019   10:50 AM  Depression screen PHQ 2/9  Decreased Interest 0 0 1 0 0  Down, Depressed, Hopeless 0 0 1 0 0  PHQ - 2 Score 0 0 2 0 0  Altered sleeping 0 0 3  0  Tired, decreased energy 1 0 1  1  Change in appetite 0 0 0  0  Feeling bad or failure about yourself  0 0 0  0  Trouble concentrating 0 0 0  0  Moving slowly or fidgety/restless 0 0 0  0  Suicidal thoughts 0 0 0  0  PHQ-9 Score 1 0 6  1  Difficult doing work/chores Not difficult at all    Not difficult at all    phq 9 is {gen pos [id number]}   Fall Risk:    03/09/2022    1:37 PM 07/14/2021    2:37 PM 10/02/2020   11:17 AM 04/03/2020   11:04 AM 09/18/2019   10:50 AM  Fall Risk   Falls in the past year? 0 0 0 0 0  Number falls in past yr:   0 0 0  Injury with Fall?   0 0 0  Risk for fall due to : No Fall Risks      Follow up Falls prevention discussed;Education provided;Falls evaluation completed Falls prevention discussed         Functional Status Survey:      Assessment & Plan  *** There are no diagnoses linked to this encounter.

## 2022-04-27 ENCOUNTER — Ambulatory Visit: Payer: BC Managed Care – PPO | Admitting: Family Medicine

## 2022-05-30 ENCOUNTER — Encounter (INDEPENDENT_AMBULATORY_CARE_PROVIDER_SITE_OTHER): Payer: Self-pay

## 2022-08-12 ENCOUNTER — Other Ambulatory Visit: Payer: Self-pay | Admitting: Family Medicine

## 2022-08-12 DIAGNOSIS — J4540 Moderate persistent asthma, uncomplicated: Secondary | ICD-10-CM

## 2022-08-12 DIAGNOSIS — I10 Essential (primary) hypertension: Secondary | ICD-10-CM

## 2022-08-12 MED ORDER — ALBUTEROL SULFATE HFA 108 (90 BASE) MCG/ACT IN AERS: INHALATION_SPRAY | RESPIRATORY_TRACT | 1 refills | Status: DC

## 2022-08-12 MED ORDER — LOSARTAN POTASSIUM-HCTZ 100-12.5 MG PO TABS: 1.0000 | ORAL_TABLET | Freq: Every day | ORAL | 0 refills | Status: DC

## 2022-08-12 NOTE — Telephone Encounter (Signed)
Pt is calling to see why her medication were denied. Please advise [phone]

## 2022-08-12 NOTE — Telephone Encounter (Signed)
Patient called and advised the reason for the refusal of medications is because at the last visit it was noted to return in a few weeks for regular follow up, she verbalized understanding. MyChart Video visit scheduled for Tuesday, 08/16/22. She asked will she receive a refill because she's almost out, advised it will be sent in.

## 2022-08-12 NOTE — Telephone Encounter (Signed)
Requested Prescriptions  Pending Prescriptions Disp Refills   albuterol (VENTOLIN HFA) 108 (90 Base) MCG/ACT inhaler 25.5 g 0    Sig: INHALE 2 PUFFS BY MOUTH EVERY 6 HOURS AS NEEDED FOR WHEEZING OR SHORTNESS OF BREATH     Pulmonology:  Beta Agonists 2 Passed - 08/12/2022 12:12 PM      Passed - Last BP in normal range    BP Readings from Last 1 Encounters:  03/09/22 130/80         Passed - Last Heart Rate in normal range    Pulse Readings from Last 1 Encounters:  03/09/22 69         Passed - Valid encounter within last 12 months    Recent Outpatient Visits           5 months ago Well adult exam   [facility] Alba Cory, MD   1 year ago Asthma, moderate persistent, well-controlled   [facility], Danna Hefty, MD   1 year ago Chronic deep vein thrombosis (DVT) of left lower extremity, unspecified vein [facility])   [facility] Alba Cory, MD   2 years ago Chronic deep vein thrombosis (DVT) of left lower extremity, unspecified vein [facility])   [facility] Alba Cory, MD   2 years ago Costochondral chest pain   [facility] Alba Cory, MD       Future Appointments             In 4 days Alba Cory, MD [facility]             losartan-hydrochlorothiazide (HYZAAR) 100-12.5 MG tablet 90 tablet 0    Sig: Take 1 tablet by mouth daily.     Cardiovascular: ARB + Diuretic Combos Failed - 08/12/2022 12:12 PM      Failed - K in normal range and within 180 days    Potassium  Date Value Ref Range Status  12/22/2021 4.0 3.5 - 5.2 mmol/L Final         Failed - Na in normal range and within 180 days    Sodium  Date Value Ref Range Status  12/22/2021 139 134 - 144 mmol/L Final         Failed - Cr in normal range and within 180 days    Creatinine  Date Value Ref Range Status  07/09/2014 0.84 0.60 - 1.30 mg/dL Final   Creatinine, Ser   Date Value Ref Range Status  12/22/2021 0.88 0.57 - 1.00 mg/dL Final         Failed - eGFR is 10 or above and within 180 days    EGFR (African American)  Date Value Ref Range Status  07/09/2014 >60 >23mL/min Final  03/28/2014 >60  Final   GFR calc Af Amer  Date Value Ref Range Status  05/03/2019 96 >59 mL/min/1.73 Final   EGFR (Non-African Amer.)  Date Value Ref Range Status  07/09/2014 >60 >64mL/min Final    Comment:    eGFR values <62mL/min/1.73 m2 may be an indication of chronic kidney disease (CKD). Calculated eGFR, using the MRDR Study equation, is useful in  patients with stable renal function. The eGFR calculation will not be reliable in acutely ill patients when serum creatinine is changing rapidly. It is not useful in patients on dialysis. The eGFR calculation may not be applicable to patients at the low and high extremes of body sizes, pregnant women, and vegetarians.   03/28/2014 >  60  Final    Comment:    eGFR values <9mL/min/1.73 m2 may be an indication of chronic kidney disease (CKD). Calculated eGFR is useful in patients with stable renal function. The eGFR calculation will not be reliable in acutely ill patients when serum creatinine is changing rapidly. It is not useful in  patients on dialysis. The eGFR calculation may not be applicable to patients at the low and high extremes of body sizes, pregnant women, and vegetarians.    GFR calc non Af Amer  Date Value Ref Range Status  05/03/2019 83 >59 mL/min/1.73 Final   eGFR  Date Value Ref Range Status  12/22/2021 78 >59 mL/min/1.73 Final         Passed - Patient is not pregnant      Passed - Last BP in normal range    BP Readings from Last 1 Encounters:  03/09/22 130/80         Passed - Valid encounter within last 6 months    Recent Outpatient Visits           5 months ago Well adult exam   [facility] Steele Sizer, MD   1 year ago Asthma, moderate persistent,  well-controlled   [facility], Drue Stager, MD   1 year ago Chronic deep vein thrombosis (DVT) of left lower extremity, unspecified vein [facility])   [facility] Steele Sizer, MD   2 years ago Chronic deep vein thrombosis (DVT) of left lower extremity, unspecified vein [facility])   [facility] Steele Sizer, MD   2 years ago Costochondral chest pain   [facility] Steele Sizer, MD       Future Appointments             In 4 days Steele Sizer, MD [facility]            Refused Prescriptions Disp Refills   losartan-hydrochlorothiazide (HYZAAR) 100-12.5 MG tablet [Pharmacy Med Name: LOSARTAN/HCTZ 100/12.5MG  TABLETS] 90 tablet 0    Sig: TAKE 1 TABLET BY MOUTH DAILY     Cardiovascular: ARB + Diuretic Combos Failed - 08/12/2022 12:12 PM      Failed - K in normal range and within 180 days    Potassium  Date Value Ref Range Status  12/22/2021 4.0 3.5 - 5.2 mmol/L Final         Failed - Na in normal range and within 180 days    Sodium  Date Value Ref Range Status  12/22/2021 139 134 - 144 mmol/L Final         Failed - Cr in normal range and within 180 days    Creatinine  Date Value Ref Range Status  07/09/2014 0.84 0.60 - 1.30 mg/dL Final   Creatinine, Ser  Date Value Ref Range Status  12/22/2021 0.88 0.57 - 1.00 mg/dL Final         Failed - eGFR is 10 or above and within 180 days    EGFR (African American)  Date Value Ref Range Status  07/09/2014 >60 >44mL/min Final  03/28/2014 >60  Final   GFR calc Af Amer  Date Value Ref Range Status  05/03/2019 96 >59 mL/min/1.73 Final   EGFR (Non-African Amer.)  Date Value Ref Range Status  07/09/2014 >60 >51mL/min Final    Comment:    eGFR values <25mL/min/1.73 m2 may be an indication of chronic kidney disease (CKD). Calculated eGFR, using the MRDR Study equation,  is useful in  patients with stable renal  function. The eGFR calculation will not be reliable in acutely ill patients when serum creatinine is changing rapidly. It is not useful in patients on dialysis. The eGFR calculation may not be applicable to patients at the low and high extremes of body sizes, pregnant women, and vegetarians.   03/28/2014 >60  Final    Comment:    eGFR values <11mL/min/1.73 m2 may be an indication of chronic kidney disease (CKD). Calculated eGFR is useful in patients with stable renal function. The eGFR calculation will not be reliable in acutely ill patients when serum creatinine is changing rapidly. It is not useful in  patients on dialysis. The eGFR calculation may not be applicable to patients at the low and high extremes of body sizes, pregnant women, and vegetarians.    GFR calc non Af Amer  Date Value Ref Range Status  05/03/2019 83 >59 mL/min/1.73 Final   eGFR  Date Value Ref Range Status  12/22/2021 78 >59 mL/min/1.73 Final         Passed - Patient is not pregnant      Passed - Last BP in normal range    BP Readings from Last 1 Encounters:  03/09/22 130/80         Passed - Valid encounter within last 6 months    Recent Outpatient Visits           5 months ago Well adult exam   [facility] Alba Cory, MD   1 year ago Asthma, moderate persistent, well-controlled   [facility], Danna Hefty, MD   1 year ago Chronic deep vein thrombosis (DVT) of left lower extremity, unspecified vein [facility])   [facility] Alba Cory, MD   2 years ago Chronic deep vein thrombosis (DVT) of left lower extremity, unspecified vein [facility])   [facility] Alba Cory, MD   2 years ago Costochondral chest pain   [facility] Alba Cory, MD       Future Appointments             In 4 days Alba Cory, MD [facility]             albuterol (VENTOLIN HFA)  108 (90 Base) MCG/ACT inhaler [Pharmacy Med Name: ALBUTEROL HFA INH (200 PUFFS) 8.5GM] 25.5 g 0    Sig: INHALE 2 PUFFS BY MOUTH EVERY 6 HOURS AS NEEDED FOR WHEEZING OR SHORTNESS OF BREATH     Pulmonology:  Beta Agonists 2 Passed - 08/12/2022 12:12 PM      Passed - Last BP in normal range    BP Readings from Last 1 Encounters:  03/09/22 130/80         Passed - Last Heart Rate in normal range    Pulse Readings from Last 1 Encounters:  03/09/22 69         Passed - Valid encounter within last 12 months    Recent Outpatient Visits           5 months ago Well adult exam   [facility] Alba Cory, MD   1 year ago Asthma, moderate persistent, well-controlled   [facility], Danna Hefty, MD   1 year ago Chronic deep vein thrombosis (DVT) of left lower extremity, unspecified vein [facility])   [facility] [place], Danna Hefty, MD   2 years ago Chronic deep vein thrombosis (DVT) of  left lower extremity, unspecified vein [facility])   [facility] Steele Sizer, MD   2 years ago Costochondral chest pain   [facility] Steele Sizer, MD       Future Appointments             In 4 days Steele Sizer, MD [facility]

## 2022-08-16 ENCOUNTER — Telehealth: Payer: BC Managed Care – PPO | Admitting: Family Medicine

## 2022-08-16 NOTE — Progress Notes (Signed)
Name: [name] [name]   MRN: [id number]    DOB: 1965-12-13   Date:08/17/2022       Progress Note  Subjective  Chief Complaint  Follow Up  HPI  Asthma: she is doing well Breo and Singulair and prn albuterol, no recent flares of asthma, denies cough, wheezing or SOB. She uses rescue inhaler very seldom and has it at home  . Did not have a flare this Fall    HTN: she stopped taking norvasc because her bp was dropped. She is compliant with losartan hctz, denies side effects of medication. Denies chest pain or palpitation    Chronic DVT: she was advised to stay on Xarelto for life, but stopped taking medication on her own at the end of 2020 .  She was seen by Dr. Lucky Cowboy and hematologist. She has a  history of IVF placed and removed in 2018  and also had a stent placed because of recurrent DVT. Last episode was 2017, took 3 years of Xarelto, stopped on her own but has been taking aspirin 81 mg daily since .She denies side effects, she is aware of risk of recurrence. No pain or swelling on her legs We will continue to monitor   Obesity: she had gained almost 20 lbs from 2018 to 2022, less active since the pandemic, but she lost about 7 lbs and weight has been stable now at 186-187 lbs. She is walking her puppy and is thinking about going to the gym with her son    AR: doing well at this time, denies rhinorrhea, nasal congestion , she is sneezing intermittently   GERD: she has been off medication and no problems, she avoid spicy food but still drinking caffeine   Patient Active Problem List   Diagnosis Date Noted   Chronic deep vein thrombosis (DVT) of left lower extremity ([place]) 05/03/2019   Hypertension 09/11/2017   Adhesive capsulitis of right shoulder 05/12/2017   Perennial allergic rhinitis with seasonal variation 07/02/2015   Hypokalemia 07/02/2015    Past Surgical History:  Procedure Laterality Date   INSERTION OF VENA CAVA FILTER  04/2014   Dr. Lucky Cowboy    IVC FILTER REMOVAL N/A  12/29/2016   Procedure: IVC Filter Removal;  Surgeon: Algernon Huxley, MD;  Location: [place] [facility];  Service: Cardiovascular;  Laterality: N/A;   PERIPHERAL VASCULAR THROMBECTOMY Left 11/01/2016   Procedure: Peripheral Vascular Thrombectomy;  Surgeon: Algernon Huxley, MD;  Location: [place] [facility];  Service: Cardiovascular;  Laterality: Left;   TUBAL LIGATION      Family History  Problem Relation Age of Onset   Diabetes Mother    Hypertension Mother    Hypercalcemia Father    Leukemia Son 3   Diabetes Maternal Grandmother    Heart disease Paternal Grandfather     Social History   Tobacco Use   Smoking status: Former    Packs/day: 0.50    Years: 10.00    Total pack years: 5.00    Types: Cigarettes    Start date: 08/01/1998    Quit date: 08/01/2008    Years since quitting: 14.0   Smokeless tobacco: Never  Substance Use Topics   Alcohol use: Not Currently    Alcohol/week: 0.0 standard drinks of alcohol    Comment: many years ago     Current Outpatient Medications:    acetaminophen (TYLENOL) 500 MG tablet, Take 1,000 mg by mouth 2 (two) times daily as needed for moderate pain., Disp: , Rfl:  albuterol (VENTOLIN HFA) 108 (90 Base) MCG/ACT inhaler, INHALE 2 PUFFS BY MOUTH EVERY 6 HOURS AS NEEDED FOR WHEEZING OR SHORTNESS OF BREATH, Disp: 25.5 g, Rfl: 1   fluticasone furoate-vilanterol (BREO ELLIPTA) 100-25 MCG/ACT AEPB, Inhale 1 puff into the lungs daily., Disp: 3 each, Rfl: 1   loratadine (CLARITIN) 10 MG tablet, Take 1 tablet (10 mg total) by mouth daily., Disp: 30 tablet, Rfl: 5   losartan-hydrochlorothiazide (HYZAAR) 100-12.5 MG tablet, Take 1 tablet by mouth daily., Disp: 90 tablet, Rfl: 0   montelukast (SINGULAIR) 10 MG tablet, Take 1 tablet (10 mg total) by mouth at bedtime., Disp: 90 tablet, Rfl: 1   Multiple Vitamin (MULTIVITAMIN WITH MINERALS) TABS tablet, Take 1 tablet by mouth daily., Disp: , Rfl:   No Known Allergies  I personally reviewed active problem list,  medication list, allergies, family history, social history, health maintenance with the patient/caregiver today.   ROS  Constitutional: Negative for fever or weight change.  Respiratory: Negative for cough and shortness of breath.   Cardiovascular: Negative for chest pain or palpitations.  Gastrointestinal: Negative for abdominal pain, no bowel changes.  Musculoskeletal: Negative for gait problem or joint swelling.  Skin: Negative for rash.  Neurological: Negative for dizziness or headache.  No other specific complaints in a complete review of systems (except as listed in HPI above).   Objective  Vitals:   08/17/22 1131  BP: 122/80  Pulse: 88  Resp: 16  SpO2: 99%  Weight: 186 lb (84.4 kg)  Height: 5\' 5"  (1.651 m)    Body mass index is 30.95 kg/m.  Physical Exam  Constitutional: Patient appears well-developed and well-nourished. Obese  No distress.  HEENT: head atraumatic, normocephalic, pupils equal and reactive to light, neck supple Cardiovascular: Normal rate, regular rhythm and normal heart sounds.  No murmur heard. No BLE edema. Pulmonary/Chest: Effort normal and breath sounds normal. No respiratory distress. Abdominal: Soft.  There is no tenderness. Psychiatric: Patient has a normal mood and affect. behavior is normal. Judgment and thought content normal.    PHQ2/9:    08/17/2022   11:31 AM 03/09/2022    2:06 PM 07/14/2021    2:37 PM 10/02/2020   11:17 AM 04/03/2020   11:05 AM  Depression screen PHQ 2/9  Decreased Interest 0 0 0 1 0  Down, Depressed, Hopeless 0 0 0 1 0  PHQ - 2 Score 0 0 0 2 0  Altered sleeping 0 0 0 3   Tired, decreased energy 0 1 0 1   Change in appetite 0 0 0 0   Feeling bad or failure about yourself  0 0 0 0   Trouble concentrating 0 0 0 0   Moving slowly or fidgety/restless 0 0 0 0   Suicidal thoughts 0 0 0 0   PHQ-9 Score 0 1 0 6   Difficult doing work/chores  Not difficult at all       phq 9 is negative   Fall Risk:    08/17/2022    11:31 AM 03/09/2022    1:37 PM 07/14/2021    2:37 PM 10/02/2020   11:17 AM 04/03/2020   11:04 AM  Fall Risk   Falls in the past year? 0 0 0 0 0  Number falls in past yr: 0   0 0  Injury with Fall? 0   0 0  Risk for fall due to : No Fall Risks No Fall Risks     Follow up Falls prevention discussed Falls  prevention discussed;Education provided;Falls evaluation completed Falls prevention discussed        Functional Status Survey: Is the patient deaf or have difficulty hearing?: No Does the patient have difficulty seeing, even when wearing glasses/contacts?: No Does the patient have difficulty concentrating, remembering, or making decisions?: No Does the patient have difficulty walking or climbing stairs?: No Does the patient have difficulty dressing or bathing?: No Does the patient have difficulty doing errands alone such as visiting a doctor's office or shopping?: No    Assessment & Plan  1. 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 Lucky Cowboy, she has been off Xarelto since 2018 and no recurrence, previously had 3 DVT's on left lower leg   2. Asthma, moderate persistent, well-controlled  - montelukast (SINGULAIR) 10 MG tablet; Take 1 tablet (10 mg total) by mouth at bedtime.  Dispense: 90 tablet; Refill: 1 - fluticasone furoate-vilanterol (BREO ELLIPTA) 100-25 MCG/ACT AEPB; Inhale 1 puff into the lungs daily.  Dispense: 3 each; Refill: 1  3. Essential hypertension  - losartan-hydrochlorothiazide (HYZAAR) 100-12.5 MG tablet; Take 1 tablet by mouth daily.  Dispense: 90 tablet; Refill: 1 - CBC with Differential/Platelet - Comprehensive metabolic panel  4. GERD without esophagitis   5. Colon cancer screening  - Fecal occult blood, imunochemical([facility])  6. Need for Tdap vaccination  - Tdap vaccine greater than or equal to 7yo IM  7. Perennial allergic rhinitis with seasonal variation  Continue medications  8.  Family history of heart disease  - Lipid panel - CRP High sensitivity  9. Lipid screening  - Lipid panel  10. Screening for diabetes mellitus  - Hemoglobin A1c

## 2022-08-17 ENCOUNTER — Encounter: Payer: Self-pay | Admitting: Family Medicine

## 2022-08-17 ENCOUNTER — Ambulatory Visit (INDEPENDENT_AMBULATORY_CARE_PROVIDER_SITE_OTHER): Payer: 59 | Admitting: Family Medicine

## 2022-08-17 VITALS — BP 122/80 | HR 88 | Resp 16 | Ht 65.0 in | Wt 186.0 lb

## 2022-08-17 DIAGNOSIS — K219 Gastro-esophageal reflux disease without esophagitis: Secondary | ICD-10-CM

## 2022-08-17 DIAGNOSIS — Z1211 Encounter for screening for malignant neoplasm of colon: Secondary | ICD-10-CM

## 2022-08-17 DIAGNOSIS — J3089 Other allergic rhinitis: Secondary | ICD-10-CM

## 2022-08-17 DIAGNOSIS — Z23 Encounter for immunization: Secondary | ICD-10-CM

## 2022-08-17 DIAGNOSIS — J4540 Moderate persistent asthma, uncomplicated: Secondary | ICD-10-CM | POA: Diagnosis not present

## 2022-08-17 DIAGNOSIS — Z8249 Family history of ischemic heart disease and other diseases of the circulatory system: Secondary | ICD-10-CM

## 2022-08-17 DIAGNOSIS — Z131 Encounter for screening for diabetes mellitus: Secondary | ICD-10-CM

## 2022-08-17 DIAGNOSIS — J302 Other seasonal allergic rhinitis: Secondary | ICD-10-CM

## 2022-08-17 DIAGNOSIS — I10 Essential (primary) hypertension: Secondary | ICD-10-CM

## 2022-08-17 DIAGNOSIS — I82402 Acute embolism and thrombosis of unspecified deep veins of left lower extremity: Secondary | ICD-10-CM

## 2022-08-17 DIAGNOSIS — Z13220 Encounter for screening for lipoid disorders: Secondary | ICD-10-CM

## 2022-08-17 MED ORDER — MONTELUKAST SODIUM 10 MG PO TABS: 10.0000 mg | ORAL_TABLET | Freq: Every day | ORAL | 1 refills | Status: DC

## 2022-08-17 MED ORDER — FLUTICASONE FUROATE-VILANTEROL 100-25 MCG/ACT IN AEPB: 1.0000 | INHALATION_SPRAY | Freq: Every day | RESPIRATORY_TRACT | 1 refills | Status: DC

## 2022-08-17 MED ORDER — LOSARTAN POTASSIUM-HCTZ 100-12.5 MG PO TABS: 1.0000 | ORAL_TABLET | Freq: Every day | ORAL | 1 refills | Status: DC

## 2022-08-25 NOTE — Progress Notes (Signed)
Name: [name] [name]   MRN: [id number]    DOB: 1965-10-08   Date:08/26/2022       Progress Note  Subjective  Chief Complaint  Body aches  HPI  Left side chest pain : she states she had noticed episodes of left side chest pain on and off for years, however it has been constant for the past 10 days. Episodes never lasted this long. She has a mild intermittent dry cough ( more like her asthma baseline) She states pain is described as dull ache that goes through the anterior left upper chest to her back. She felt nauseated while walking faster on her treadmill, she also has noticed some lightheadedness  when walking around her house. It is uncomfortable when she first goes to bed. Her chest feels heavy and full. She has a history of DVT but no calf pain   She denies wheezing or increase in cough  She denies pain with palpation of chest   No calf pain or leg edema  Patient Active Problem List   Diagnosis Date Noted   Chronic deep vein thrombosis (DVT) of left lower extremity ([place]) 05/03/2019   Hypertension 09/11/2017   Adhesive capsulitis of right shoulder 05/12/2017   Perennial allergic rhinitis with seasonal variation 07/02/2015   Hypokalemia 07/02/2015    Past Surgical History:  Procedure Laterality Date   INSERTION OF VENA CAVA FILTER  04/2014   Dr. Lucky Cowboy    IVC FILTER REMOVAL N/A 12/29/2016   Procedure: IVC Filter Removal;  Surgeon: Algernon Huxley, MD;  Location: [facility];  Service: Cardiovascular;  Laterality: N/A;   PERIPHERAL VASCULAR THROMBECTOMY Left 11/01/2016   Procedure: Peripheral Vascular Thrombectomy;  Surgeon: Algernon Huxley, MD;  Location: [facility];  Service: Cardiovascular;  Laterality: Left;   TUBAL LIGATION      Family History  Problem Relation Age of Onset   Diabetes Mother    Hypertension Mother    Hypercalcemia Father    Leukemia Son 3   Diabetes Maternal Grandmother    Heart disease Paternal Grandfather     Social History    Tobacco Use   Smoking status: Former    Packs/day: 0.50    Years: 10.00    Total pack years: 5.00    Types: Cigarettes    Start date: 08/01/1998    Quit date: 08/01/2008    Years since quitting: 14.0   Smokeless tobacco: Never  Substance Use Topics   Alcohol use: Not Currently    Alcohol/week: 0.0 standard drinks of alcohol    Comment: many years ago     Current Outpatient Medications:    acetaminophen (TYLENOL) 500 MG tablet, Take 1,000 mg by mouth 2 (two) times daily as needed for moderate pain., Disp: , Rfl:    albuterol (VENTOLIN HFA) 108 (90 Base) MCG/ACT inhaler, INHALE 2 PUFFS BY MOUTH EVERY 6 HOURS AS NEEDED FOR WHEEZING OR SHORTNESS OF BREATH, Disp: 25.5 g, Rfl: 1   fluticasone furoate-vilanterol (BREO ELLIPTA) 100-25 MCG/ACT AEPB, Inhale 1 puff into the lungs daily., Disp: 3 each, Rfl: 1   loratadine (CLARITIN) 10 MG tablet, Take 1 tablet (10 mg total) by mouth daily., Disp: 30 tablet, Rfl: 5   losartan-hydrochlorothiazide (HYZAAR) 100-12.5 MG tablet, Take 1 tablet by mouth daily., Disp: 90 tablet, Rfl: 1   montelukast (SINGULAIR) 10 MG tablet, Take 1 tablet (10 mg total) by mouth at bedtime., Disp: 90 tablet, Rfl: 1   Multiple Vitamin (MULTIVITAMIN WITH MINERALS) TABS tablet,  Take 1 tablet by mouth daily., Disp: , Rfl:   No Known Allergies  I personally reviewed active problem list, medication list, allergies, family history, social history, health maintenance with the patient/caregiver today.   ROS  Ten systems reviewed and is negative except as mentioned in HPI   Objective  Vitals:   08/26/22 1051  BP: 126/84  Pulse: 81  Resp: 16  Temp: 98.4 F (36.9 C)  TempSrc: Oral  SpO2: 99%  Weight: 188 lb 1.6 oz (85.3 kg)  Height: 5\' 5"  (1.651 m)    Body mass index is 31.3 kg/m.  Physical Exam  Constitutional: Patient appears well-developed and well-nourished. Obese  No distress.  HEENT: head atraumatic, normocephalic, pupils equal and reactive to light,,  neck supple Cardiovascular: Normal rate, regular rhythm and normal heart sounds.  No murmur heard. No BLE edema. Pulmonary/Chest: Effort normal and breath sounds normal. Normal palpation of chest wall. No respiratory distress. Abdominal: Soft.  There is no tenderness. Psychiatric: Patient has a normal mood and affect. behavior is normal. Judgment and thought content normal.   Recent Results (from the past 2160 hour(s))  Lipid panel     Status: Abnormal   Collection Time: 08/24/22 12:00 AM  Result Value Ref Range   Cholesterol, Total 201 (H) 100 - 199 mg/dL   Triglycerides 59 0 - 149 mg/dL   HDL 66 >39 mg/dL   VLDL Cholesterol Cal 11 5 - 40 mg/dL   LDL Chol Calc (NIH) 124 (H) 0 - 99 mg/dL   Chol/HDL Ratio 3.0 0.0 - 4.4 ratio    Comment:                                   T. Chol/HDL Ratio                                             Men  Women                               1/2 Avg.Risk  3.4    3.3                                   Avg.Risk  5.0    4.4                                2X Avg.Risk  9.6    7.1                                3X Avg.Risk 23.4   11.0   CBC with Differential/Platelet     Status: None   Collection Time: 08/24/22 12:00 AM  Result Value Ref Range   WBC 5.5 3.4 - 10.8 x10E3/uL   RBC 4.53 3.77 - 5.28 x10E6/uL   Hemoglobin 12.6 11.1 - 15.9 g/dL   Hematocrit 38.9 34.0 - 46.6 %   MCV 86 79 - 97 fL   MCH 27.8 26.6 - 33.0 pg   MCHC 32.4 31.5 - 35.7 g/dL   RDW 12.2 11.7 - 15.4 %  Platelets 223 150 - 450 x10E3/uL   Neutrophils 35 Not Estab. %   Lymphs 53 Not Estab. %   Monocytes 7 Not Estab. %   Eos 3 Not Estab. %   Basos 2 Not Estab. %   Neutrophils Absolute 2.0 1.4 - 7.0 x10E3/uL   Lymphocytes Absolute 2.9 0.7 - 3.1 x10E3/uL   Monocytes Absolute 0.4 0.1 - 0.9 x10E3/uL   EOS (ABSOLUTE) 0.2 0.0 - 0.4 x10E3/uL   Basophils Absolute 0.1 0.0 - 0.2 x10E3/uL   Immature Granulocytes 0 Not Estab. %   Immature Grans (Abs) 0.0 0.0 - 0.1 x10E3/uL  Hemoglobin A1c      Status: Abnormal   Collection Time: 08/24/22 12:00 AM  Result Value Ref Range   Hgb A1c MFr Bld 5.8 (H) 4.8 - 5.6 %    Comment:          Prediabetes: 5.7 - 6.4          Diabetes: >6.4          Glycemic control for adults with diabetes: <7.0    Est. average glucose Bld gHb Est-mCnc 120 mg/dL  Comprehensive metabolic panel     Status: Abnormal   Collection Time: 08/24/22 12:00 AM  Result Value Ref Range   Glucose 89 70 - 99 mg/dL   BUN 11 6 - 24 mg/dL   Creatinine, Ser 4.09 0.57 - 1.00 mg/dL   eGFR 811 >91 YN/WGN/5.62   BUN/Creatinine Ratio 16 9 - 23   Sodium 139 134 - 144 mmol/L   Potassium 3.3 (L) 3.5 - 5.2 mmol/L   Chloride 98 96 - 106 mmol/L   CO2 26 20 - 29 mmol/L   Calcium 9.9 8.7 - 10.2 mg/dL   Total Protein 7.3 6.0 - 8.5 g/dL   Albumin 4.5 3.8 - 4.9 g/dL   Globulin, Total 2.8 1.5 - 4.5 g/dL   Albumin/Globulin Ratio 1.6 1.2 - 2.2   Bilirubin Total 0.3 0.0 - 1.2 mg/dL   Alkaline Phosphatase 61 44 - 121 IU/L   AST 20 0 - 40 IU/L   ALT 13 0 - 32 IU/L  CRP High sensitivity     Status: None   Collection Time: 08/24/22 12:00 AM  Result Value Ref Range   CRP, High Sensitivity 1.80 0.00 - 3.00 mg/L    Comment:          Relative Risk for Future Cardiovascular Event                              Low                 <1.00                              Average       1.00 - 3.00                              High                >3.00     PHQ2/9:    08/26/2022   10:53 AM 08/17/2022   11:31 AM 03/09/2022    2:06 PM 07/14/2021    2:37 PM 10/02/2020   11:17 AM  Depression screen PHQ 2/9  Decreased Interest 0 0 0 0 1  Down, Depressed, Hopeless 0 0  0 0 1  PHQ - 2 Score 0 0 0 0 2  Altered sleeping 0 0 0 0 3  Tired, decreased energy 0 0 1 0 1  Change in appetite 0 0 0 0 0  Feeling bad or failure about yourself  0 0 0 0 0  Trouble concentrating 0 0 0 0 0  Moving slowly or fidgety/restless 0 0 0 0 0  Suicidal thoughts 0 0 0 0 0  PHQ-9 Score 0 0 1 0 6  Difficult doing work/chores    Not difficult at all      phq 9 is negative   Fall Risk:    08/26/2022   10:53 AM 08/17/2022   11:31 AM 03/09/2022    1:37 PM 07/14/2021    2:37 PM 10/02/2020   11:17 AM  Fall Risk   Falls in the past year? 0 0 0 0 0  Number falls in past yr:  0   0  Injury with Fall?  0   0  Risk for fall due to : No Fall Risks No Fall Risks No Fall Risks    Follow up Falls prevention discussed Falls prevention discussed Falls prevention discussed;Education provided;Falls evaluation completed Falls prevention discussed       Functional Status Survey: Is the patient deaf or have difficulty hearing?: No Does the patient have difficulty seeing, even when wearing glasses/contacts?: No Does the patient have difficulty concentrating, remembering, or making decisions?: No Does the patient have difficulty walking or climbing stairs?: No Does the patient have difficulty dressing or bathing?: No Does the patient have difficulty doing errands alone such as visiting a doctor's office or shopping?: No    Assessment & Plan  1. Left-sided chest pain  - EKG 12-Lead - D-Dimer, Quantitative - CT CARDIAC SCORING; Future  Reviewed recent labs, discussed options, if high D-dimer we will check CT angiogram  Start with cardiac calcium score   2. Family history of heart disease  - CT CARDIAC SCORING; Future

## 2022-08-26 ENCOUNTER — Encounter: Payer: Self-pay | Admitting: Family Medicine

## 2022-08-26 ENCOUNTER — Other Ambulatory Visit: Payer: Self-pay | Admitting: Family Medicine

## 2022-08-26 ENCOUNTER — Ambulatory Visit (INDEPENDENT_AMBULATORY_CARE_PROVIDER_SITE_OTHER): Payer: 59 | Admitting: Family Medicine

## 2022-08-26 VITALS — BP 126/84 | HR 81 | Temp 98.4°F | Resp 16 | Ht 65.0 in | Wt 188.1 lb

## 2022-08-26 DIAGNOSIS — R079 Chest pain, unspecified: Secondary | ICD-10-CM

## 2022-08-26 DIAGNOSIS — Z8249 Family history of ischemic heart disease and other diseases of the circulatory system: Secondary | ICD-10-CM

## 2022-08-26 DIAGNOSIS — Z1211 Encounter for screening for malignant neoplasm of colon: Secondary | ICD-10-CM

## 2022-08-26 LAB — COMPREHENSIVE METABOLIC PANEL
ALT: 13 IU/L (ref 0–32)
AST: 20 IU/L (ref 0–40)
Albumin/Globulin Ratio: 1.6 (ref 1.2–2.2)
Albumin: 4.5 g/dL (ref 3.8–4.9)
Alkaline Phosphatase: 61 IU/L (ref 44–121)
BUN/Creatinine Ratio: 16 (ref 9–23)
BUN: 11 mg/dL (ref 6–24)
Bilirubin Total: 0.3 mg/dL (ref 0.0–1.2)
CO2: 26 mmol/L (ref 20–29)
Calcium: 9.9 mg/dL (ref 8.7–10.2)
Chloride: 98 mmol/L (ref 96–106)
Creatinine, Ser: 0.68 mg/dL (ref 0.57–1.00)
Globulin, Total: 2.8 g/dL (ref 1.5–4.5)
Glucose: 89 mg/dL (ref 70–99)
Potassium: 3.3 mmol/L — ABNORMAL LOW (ref 3.5–5.2)
Sodium: 139 mmol/L (ref 134–144)
Total Protein: 7.3 g/dL (ref 6.0–8.5)
eGFR: 103 mL/min/{1.73_m2} (ref >59)

## 2022-08-26 LAB — CBC WITH DIFFERENTIAL/PLATELET
Basophils Absolute: 0.1 10*3/uL (ref 0.0–0.2)
Basos: 2 %
EOS (ABSOLUTE): 0.2 10*3/uL (ref 0.0–0.4)
Eos: 3 %
Hematocrit: 38.9 % (ref 34.0–46.6)
Hemoglobin: 12.6 g/dL (ref 11.1–15.9)
Immature Grans (Abs): 0 10*3/uL (ref 0.0–0.1)
Immature Granulocytes: 0 %
Lymphocytes Absolute: 2.9 10*3/uL (ref 0.7–3.1)
Lymphs: 53 %
MCH: 27.8 pg (ref 26.6–33.0)
MCHC: 32.4 g/dL (ref 31.5–35.7)
MCV: 86 fL (ref 79–97)
Monocytes Absolute: 0.4 10*3/uL (ref 0.1–0.9)
Monocytes: 7 %
Neutrophils Absolute: 2 10*3/uL (ref 1.4–7.0)
Neutrophils: 35 %
Platelets: 223 10*3/uL (ref 150–450)
RBC: 4.53 x10E6/uL (ref 3.77–5.28)
RDW: 12.2 % (ref 11.7–15.4)
WBC: 5.5 10*3/uL (ref 3.4–10.8)

## 2022-08-26 LAB — LIPID PANEL
Chol/HDL Ratio: 3 ratio (ref 0.0–4.4)
Cholesterol, Total: 201 mg/dL — ABNORMAL HIGH (ref 100–199)
HDL: 66 mg/dL (ref >39)
LDL Chol Calc (NIH): 124 mg/dL — ABNORMAL HIGH (ref 0–99)
Triglycerides: 59 mg/dL (ref 0–149)
VLDL Cholesterol Cal: 11 mg/dL (ref 5–40)

## 2022-08-26 LAB — HIGH SENSITIVITY CRP: CRP, High Sensitivity: 1.8 mg/L (ref 0.00–3.00)

## 2022-08-26 LAB — HEMOGLOBIN A1C
Est. average glucose Bld gHb Est-mCnc: 120 mg/dL
Hgb A1c MFr Bld: 5.8 % — ABNORMAL HIGH (ref 4.8–5.6)

## 2022-08-27 LAB — D-DIMER, QUANTITATIVE: D-DIMER: 1.74 mg/L FEU — ABNORMAL HIGH (ref 0.00–0.49)

## 2022-08-29 ENCOUNTER — Other Ambulatory Visit: Payer: Self-pay

## 2022-08-29 ENCOUNTER — Ambulatory Visit
Admission: RE | Admit: 2022-08-29 | Discharge: 2022-08-29 | Disposition: A | Discharge status: Home / Self Care | Payer: 59 | Source: Ambulatory Visit | Attending: Family Medicine | Admitting: Family Medicine

## 2022-08-29 DIAGNOSIS — R7989 Other specified abnormal findings of blood chemistry: Secondary | ICD-10-CM | POA: Diagnosis not present

## 2022-08-29 DIAGNOSIS — R000 Tachycardia, unspecified: Secondary | ICD-10-CM

## 2022-08-29 DIAGNOSIS — R0602 Shortness of breath: Secondary | ICD-10-CM | POA: Insufficient documentation

## 2022-08-29 MED ORDER — IOHEXOL 350 MG/ML SOLN
50.0000 mL | Freq: Once | INTRAVENOUS | Status: AC | PRN
  Administered 2022-08-29: 50 mL via INTRAVENOUS

## 2022-09-07 ENCOUNTER — Ambulatory Visit
Admission: RE | Admit: 2022-09-07 | Discharge: 2022-09-07 | Disposition: A | Discharge status: Home / Self Care | Payer: 59 | Source: Ambulatory Visit | Attending: Family Medicine | Admitting: Family Medicine

## 2022-09-07 DIAGNOSIS — R079 Chest pain, unspecified: Secondary | ICD-10-CM

## 2022-09-07 DIAGNOSIS — Z8249 Family history of ischemic heart disease and other diseases of the circulatory system: Secondary | ICD-10-CM | POA: Insufficient documentation

## 2023-02-14 NOTE — Progress Notes (Deleted)
Name: [name] [name]   MRN: [id number]    DOB: [birth year]   Date:02/14/2023       Progress Note  Subjective  Chief Complaint  Follow Up  HPI  Asthma: she is doing well Breo and Singulair and prn albuterol, no recent flares of asthma, denies cough, wheezing or SOB. She uses rescue inhaler very seldom and has it at home  . Did not have a flare this Fall    HTN: she stopped taking norvasc because her bp was dropped. She is compliant with losartan hctz, denies side effects of medication. Denies chest pain or palpitation    Chronic DVT: she was advised to stay on Xarelto for life, but stopped taking medication on her own at the end of 2020 .  She was seen by Dr. Wyn Quaker and hematologist. She has a  history of IVF placed and removed in 2018  and also had a stent placed because of recurrent DVT. Last episode was 2017, took 3 years of Xarelto, stopped on her own but has been taking aspirin 81 mg daily since .She denies side effects, she is aware of risk of recurrence. No pain or swelling on her legs We will continue to monitor   Obesity: she had gained almost 20 lbs from 2018 to 2022, less active since the pandemic, but she lost about 7 lbs and weight has been stable now at 186-187 lbs. She is walking her puppy and is thinking about going to the gym with her son    AR: doing well at this time, denies rhinorrhea, nasal congestion , she is sneezing intermittently   GERD: she has been off medication and no problems, she avoid spicy food but still drinking caffeine   Patient Active Problem List   Diagnosis Date Noted   Chronic deep vein thrombosis (DVT) of left lower extremity ([facility]) 05/03/2019   Hypertension 09/11/2017   Adhesive capsulitis of right shoulder 05/12/2017   Perennial allergic rhinitis with seasonal variation 07/02/2015   Hypokalemia 07/02/2015    Past Surgical History:  Procedure Laterality Date   INSERTION OF VENA CAVA FILTER  04/2014   Dr. Wyn Quaker    IVC FILTER REMOVAL N/A  12/29/2016   Procedure: IVC Filter Removal;  Surgeon: Annice Needy, MD;  Location: [facility];  Service: Cardiovascular;  Laterality: N/A;   PERIPHERAL VASCULAR THROMBECTOMY Left 11/01/2016   Procedure: Peripheral Vascular Thrombectomy;  Surgeon: Annice Needy, MD;  Location: [facility];  Service: Cardiovascular;  Laterality: Left;   TUBAL LIGATION      Family History  Problem Relation Age of Onset   Diabetes Mother    Hypertension Mother    Hypercalcemia Father    Leukemia Son 3   Diabetes Maternal Grandmother    Heart disease Paternal Grandfather     Social History   Tobacco Use   Smoking status: Former    Current packs/day: 0.00    Average packs/day: 0.5 packs/day for 10.0 years (5.0 ttl pk-yrs)    Types: Cigarettes    Start date: 08/01/1998    Quit date: 08/01/2008    Years since quitting: 14.5   Smokeless tobacco: Never  Substance Use Topics   Alcohol use: Not Currently    Alcohol/week: 0.0 standard drinks of alcohol    Comment: many years ago     Current Outpatient Medications:    acetaminophen (TYLENOL) 500 MG tablet, Take 1,000 mg by mouth 2 (two) times daily as needed for moderate pain., Disp: ,  Rfl:    albuterol (VENTOLIN HFA) 108 (90 Base) MCG/ACT inhaler, INHALE 2 PUFFS BY MOUTH EVERY 6 HOURS AS NEEDED FOR WHEEZING OR SHORTNESS OF BREATH, Disp: 25.5 g, Rfl: 1   fluticasone furoate-vilanterol (BREO ELLIPTA) 100-25 MCG/ACT AEPB, Inhale 1 puff into the lungs daily., Disp: 3 each, Rfl: 1   loratadine (CLARITIN) 10 MG tablet, Take 1 tablet (10 mg total) by mouth daily., Disp: 30 tablet, Rfl: 5   losartan-hydrochlorothiazide (HYZAAR) 100-12.5 MG tablet, Take 1 tablet by mouth daily., Disp: 90 tablet, Rfl: 1   montelukast (SINGULAIR) 10 MG tablet, Take 1 tablet (10 mg total) by mouth at bedtime., Disp: 90 tablet, Rfl: 1   Multiple Vitamin (MULTIVITAMIN WITH MINERALS) TABS tablet, Take 1 tablet by mouth daily., Disp: , Rfl:   No Known Allergies  I  personally reviewed active problem list, medication list, allergies, family history, social history, health maintenance with the patient/caregiver today.   ROS  ***  Objective  There were no vitals filed for this visit.  There is no height or weight on file to calculate BMI.  Physical Exam ***  No results found for this or any previous visit (from the past 2160 hour(s)).   PHQ2/9:    08/26/2022   10:53 AM 08/17/2022   11:31 AM 03/09/2022    2:06 PM 07/14/2021    2:37 PM 10/02/2020   11:17 AM  Depression screen PHQ 2/9  Decreased Interest 0 0 0 0 1  Down, Depressed, Hopeless 0 0 0 0 1  PHQ - 2 Score 0 0 0 0 2  Altered sleeping 0 0 0 0 3  Tired, decreased energy 0 0 1 0 1  Change in appetite 0 0 0 0 0  Feeling bad or failure about yourself  0 0 0 0 0  Trouble concentrating 0 0 0 0 0  Moving slowly or fidgety/restless 0 0 0 0 0  Suicidal thoughts 0 0 0 0 0  PHQ-9 Score 0 0 1 0 6  Difficult doing work/chores   Not difficult at all      phq 9 is {gen pos [id number]}   Fall Risk:    08/26/2022   10:53 AM 08/17/2022   11:31 AM 03/09/2022    1:37 PM 07/14/2021    2:37 PM 10/02/2020   11:17 AM  Fall Risk   Falls in the past year? 0 0 0 0 0  Number falls in past yr:  0   0  Injury with Fall?  0   0  Risk for fall due to : No Fall Risks No Fall Risks No Fall Risks    Follow up Falls prevention discussed Falls prevention discussed Falls prevention discussed;Education provided;Falls evaluation completed Falls prevention discussed       Functional Status Survey:      Assessment & Plan  *** There are no diagnoses linked to this encounter.

## 2023-02-15 ENCOUNTER — Ambulatory Visit: Payer: 59 | Admitting: Family Medicine

## 2023-03-17 NOTE — Progress Notes (Unsigned)
Name: [name] [name]   MRN: [id number]    DOB: 1965-11-11   Date:03/20/2023       Progress Note  Subjective  Chief Complaint  Follow Up  HPI  Asthma: she is doing well Breo and Singulair and prn albuterol, no recent flares of asthma, denies cough, wheezing or SOB. She still has a refill of rescue inhaler at pharmacy  HTN: she stopped taking norvasc because her bp was dropped. She is compliant with losartan hctz, denies side effects of medication. Denies chest pain or palpitation. BP initially slightly elevated but she drank a Red Bull before arrival , bp improved before she left    Chronic DVT: she was advised to stay on Xarelto for life, but stopped taking medication on her own at the end of 2020 .  She was seen by Dr. Wyn Quaker and hematologist. She has a  history of IVF placed and removed in 2018  and also had a stent placed because of recurrent DVT. Last episode was 2017, took 3 years of Xarelto, stopped on her own but has been taking aspirin 81 mg daily since .She denies side effects, she is aware of risk of recurrence. No pain or swelling on her legs. Unchanged   Obesity: she had gained almost 20 lbs from 2018 to 2022, less active since the pandemic, her weight is trending down , today is 6 lbs less than last visit, continue regular physical activity   AR: doing well at this time, denies rhinorrhea, nasal congestion , she takes medication daily   GERD: she has been off medication and no problems, she avoid spicy food but still drinking caffeine and energy drinks   Bilateral shoulder pain: seen by ortho in the past with impingement sign, symptoms not as severe but her dog and her son's dog pulled her forward when going down the stairs while she was holding their leashes and right shoulder has been painful since. She will contact them directly for a follow up if needed. We will try [facility]'s for now   Hyperlipidemia: she would like to have it rechecked today   The 10-year ASCVD risk  score (Arnett DK, et al., 2019) is: 4.9%   Values used to calculate the score:     Age: 57 years     Sex: Female     Is Non-Hispanic African American: Yes     Diabetic: No     Tobacco smoker: No     Systolic Blood Pressure: 134 mmHg     Is BP treated: Yes     HDL Cholesterol: 66 mg/dL     Total Cholesterol: 201 mg/dL   Patient Active Problem List   Diagnosis Date Noted   Chronic deep vein thrombosis (DVT) of left lower extremity ([facility]) 05/03/2019   Hypertension 09/11/2017   Adhesive capsulitis of right shoulder 05/12/2017   Perennial allergic rhinitis with seasonal variation 07/02/2015   Hypokalemia 07/02/2015    Past Surgical History:  Procedure Laterality Date   INSERTION OF VENA CAVA FILTER  04/2014   Dr. Wyn Quaker    IVC FILTER REMOVAL N/A 12/29/2016   Procedure: IVC Filter Removal;  Surgeon: Annice Needy, MD;  Location: [facility];  Service: Cardiovascular;  Laterality: N/A;   PERIPHERAL VASCULAR THROMBECTOMY Left 11/01/2016   Procedure: Peripheral Vascular Thrombectomy;  Surgeon: Annice Needy, MD;  Location: [facility];  Service: Cardiovascular;  Laterality: Left;   TUBAL LIGATION      Family History  Problem Relation  Age of Onset   Diabetes Mother    Hypertension Mother    Hypercalcemia Father    Leukemia Son 3   Diabetes Maternal Grandmother    Heart disease Paternal Grandfather     Social History   Tobacco Use   Smoking status: Former    Current packs/day: 0.00    Average packs/day: 0.5 packs/day for 10.0 years (5.0 ttl pk-yrs)    Types: Cigarettes    Start date: 08/01/1998    Quit date: 08/01/2008    Years since quitting: 14.6   Smokeless tobacco: Never  Substance Use Topics   Alcohol use: Not Currently    Alcohol/week: 0.0 standard drinks of alcohol    Comment: many years ago     Current Outpatient Medications:    acetaminophen (TYLENOL) 500 MG tablet, Take 1,000 mg by mouth 2 (two) times daily as needed for moderate pain., Disp: , Rfl:     albuterol (VENTOLIN HFA) 108 (90 Base) MCG/ACT inhaler, INHALE 2 PUFFS BY MOUTH EVERY 6 HOURS AS NEEDED FOR WHEEZING OR SHORTNESS OF BREATH, Disp: 25.5 g, Rfl: 1   fluticasone furoate-vilanterol (BREO ELLIPTA) 100-25 MCG/ACT AEPB, Inhale 1 puff into the lungs daily., Disp: 3 each, Rfl: 1   loratadine (CLARITIN) 10 MG tablet, Take 1 tablet (10 mg total) by mouth daily., Disp: 30 tablet, Rfl: 5   losartan-hydrochlorothiazide (HYZAAR) 100-12.5 MG tablet, Take 1 tablet by mouth daily., Disp: 90 tablet, Rfl: 1   montelukast (SINGULAIR) 10 MG tablet, Take 1 tablet (10 mg total) by mouth at bedtime., Disp: 90 tablet, Rfl: 1   Multiple Vitamin (MULTIVITAMIN WITH MINERALS) TABS tablet, Take 1 tablet by mouth daily., Disp: , Rfl:   No Known Allergies  I personally reviewed active problem list, medication list, allergies, family history, social history, health maintenance with the patient/caregiver today.   ROS  Constitutional: Negative for fever , positive for mild weight change.  Respiratory: Negative for cough and shortness of breath.   Cardiovascular: Negative for chest pain or palpitations.  Gastrointestinal: Negative for abdominal pain, no bowel changes.  Musculoskeletal: Negative for gait problem or joint swelling.  Skin: Negative for rash.  Neurological: Negative for dizziness or headache.  No other specific complaints in a complete review of systems (except as listed in HPI above).   Objective  Vitals:   03/20/23 1451  BP: 134/78  Pulse: 85  Resp: 16  SpO2: 98%  Weight: 182 lb (82.6 kg)  Height: 5\' 5"  (1.651 m)    Body mass index is 30.29 kg/m.  Physical Exam  Constitutional: Patient appears well-developed and well-nourished. Obese  No distress.  HEENT: head atraumatic, normocephalic, pupils equal and reactive to light, neck supple Cardiovascular: Normal rate, regular rhythm and normal heart sounds.  No murmur heard. No BLE edema. Pulmonary/Chest: Effort normal and breath  sounds normal. No respiratory distress. Abdominal: Soft.  There is no tenderness. Psychiatric: Patient has a normal mood and affect. behavior is normal. Judgment and thought content normal.      PHQ2/9:    03/20/2023    2:51 PM 08/26/2022   10:53 AM 08/17/2022   11:31 AM 03/09/2022    2:06 PM 07/14/2021    2:37 PM  Depression screen PHQ 2/9  Decreased Interest 0 0 0 0 0  Down, Depressed, Hopeless 0 0 0 0 0  PHQ - 2 Score 0 0 0 0 0  Altered sleeping 0 0 0 0 0  Tired, decreased energy 0 0 0 1 0  Change  in appetite 0 0 0 0 0  Feeling bad or failure about yourself  0 0 0 0 0  Trouble concentrating 0 0 0 0 0  Moving slowly or fidgety/restless 0 0 0 0 0  Suicidal thoughts 0 0 0 0 0  PHQ-9 Score 0 0 0 1 0  Difficult doing work/chores    Not difficult at all     phq 9 is negative   Fall Risk:    03/20/2023    2:51 PM 08/26/2022   10:53 AM 08/17/2022   11:31 AM 03/09/2022    1:37 PM 07/14/2021    2:37 PM  Fall Risk   Falls in the past year? 0 0 0 0 0  Number falls in past yr: 0  0    Injury with Fall? 0  0    Risk for fall due to : No Fall Risks No Fall Risks No Fall Risks No Fall Risks   Follow up Falls prevention discussed Falls prevention discussed Falls prevention discussed Falls prevention discussed;Education provided;Falls evaluation completed Falls prevention discussed      Functional Status Survey: Is the patient deaf or have difficulty hearing?: No Does the patient have difficulty seeing, even when wearing glasses/contacts?: No Does the patient have difficulty concentrating, remembering, or making decisions?: No Does the patient have difficulty walking or climbing stairs?: No Does the patient have difficulty dressing or bathing?: No Does the patient have difficulty doing errands alone such as visiting a doctor's office or shopping?: No    Assessment & Plan  1. Essential hypertension  - CBC with Differential/Platelet - Comp. Metabolic Panel (12) -  losartan-hydrochlorothiazide (HYZAAR) 100-12.5 MG tablet; Take 1 tablet by mouth daily.  Dispense: 90 tablet; Refill: 1  2. GERD without esophagitis  Not having problems lately   3. Asthma, moderate persistent, well-controlled  - montelukast (SINGULAIR) 10 MG tablet; Take 1 tablet (10 mg total) by mouth at bedtime.  Dispense: 90 tablet; Refill: 1 - fluticasone furoate-vilanterol (BREO ELLIPTA) 100-25 MCG/ACT AEPB; Inhale 1 puff into the lungs daily.  Dispense: 3 each; Refill: 1  4. Perennial allergic rhinitis with seasonal variation  Takign otc claritin and also singular   5. Colon cancer screening  - Fecal occult blood, imunochemical([facility])  6. Chronic deep vein thrombosis (DVT) of left lower extremity, unspecified vein ([facility])  Not on medication, denies pain   7. Other fatigue  - B12 and Folate Panel - VITAMIN D 25 Hydroxy (Vit-D Deficiency, Fractures) - TSH  8. Diabetes mellitus screening  - Hemoglobin A1c  9. Dyslipidemia  - Lipid panel  10. Chronic pain of both shoulders  - celecoxib (CELEBREX) 100 MG capsule; Take 1 capsule (100 mg total) by mouth 2 (two) times daily as needed.  Dispense: 180 capsule; Refill: 0

## 2023-03-20 ENCOUNTER — Encounter: Payer: Self-pay | Admitting: Family Medicine

## 2023-03-20 ENCOUNTER — Ambulatory Visit: Payer: 59 | Admitting: Family Medicine

## 2023-03-20 VITALS — BP 134/78 | HR 85 | Resp 16 | Ht 65.0 in | Wt 182.0 lb

## 2023-03-20 DIAGNOSIS — I10 Essential (primary) hypertension: Secondary | ICD-10-CM | POA: Diagnosis not present

## 2023-03-20 DIAGNOSIS — R5383 Other fatigue: Secondary | ICD-10-CM

## 2023-03-20 DIAGNOSIS — G8929 Other chronic pain: Secondary | ICD-10-CM

## 2023-03-20 DIAGNOSIS — J3089 Other allergic rhinitis: Secondary | ICD-10-CM

## 2023-03-20 DIAGNOSIS — M25512 Pain in left shoulder: Secondary | ICD-10-CM

## 2023-03-20 DIAGNOSIS — J4540 Moderate persistent asthma, uncomplicated: Secondary | ICD-10-CM | POA: Diagnosis not present

## 2023-03-20 DIAGNOSIS — M25511 Pain in right shoulder: Secondary | ICD-10-CM

## 2023-03-20 DIAGNOSIS — Z1211 Encounter for screening for malignant neoplasm of colon: Secondary | ICD-10-CM | POA: Diagnosis not present

## 2023-03-20 DIAGNOSIS — Z131 Encounter for screening for diabetes mellitus: Secondary | ICD-10-CM

## 2023-03-20 DIAGNOSIS — I82502 Chronic embolism and thrombosis of unspecified deep veins of left lower extremity: Secondary | ICD-10-CM

## 2023-03-20 DIAGNOSIS — E785 Hyperlipidemia, unspecified: Secondary | ICD-10-CM

## 2023-03-20 DIAGNOSIS — J302 Other seasonal allergic rhinitis: Secondary | ICD-10-CM

## 2023-03-20 MED ORDER — CELECOXIB 100 MG PO CAPS: 100.0000 mg | ORAL_CAPSULE | Freq: Two times a day (BID) | ORAL | 0 refills | Status: DC | PRN

## 2023-03-20 MED ORDER — MONTELUKAST SODIUM 10 MG PO TABS: 10.0000 mg | ORAL_TABLET | Freq: Every day | ORAL | 1 refills | Status: DC

## 2023-03-20 MED ORDER — FLUTICASONE FUROATE-VILANTEROL 100-25 MCG/ACT IN AEPB: 1.0000 | INHALATION_SPRAY | Freq: Every day | RESPIRATORY_TRACT | 1 refills | Status: DC

## 2023-03-20 MED ORDER — LOSARTAN POTASSIUM-HCTZ 100-12.5 MG PO TABS: 1.0000 | ORAL_TABLET | Freq: Every day | ORAL | 1 refills | Status: DC

## 2023-07-22 ENCOUNTER — Other Ambulatory Visit: Payer: Self-pay | Admitting: Family Medicine

## 2023-07-22 DIAGNOSIS — I10 Essential (primary) hypertension: Secondary | ICD-10-CM

## 2023-10-18 ENCOUNTER — Ambulatory Visit: Payer: Self-pay | Admitting: Family Medicine

## 2023-10-18 ENCOUNTER — Other Ambulatory Visit: Payer: Self-pay | Admitting: Family Medicine

## 2023-10-18 DIAGNOSIS — I10 Essential (primary) hypertension: Secondary | ICD-10-CM

## 2023-10-18 NOTE — Telephone Encounter (Signed)
 Copied from CRM [id number]. Topic: Clinical - Medication Refill >> Oct 18, 2023 11:17 AM Elle L wrote: Most Recent Primary Care Visit:  Provider: Alba Cory  Department: [facility]  Visit Type: OFFICE VISIT  Date: 03/20/2023  Medication: losartan-hydrochlorothiazide (HYZAAR) 100-12.5 MG tablet  Has the patient contacted their pharmacy? Yes  Is this the correct pharmacy for this prescription? Yes If no, delete pharmacy and type the correct one.  This is the patient's preferred pharmacy:  [facility] [phone] - [place], [place] - [address] AT [facility] [address] [place] [address] Phone: [phone] Fax: [phone]  Has the prescription been filled recently? No  Is the patient out of the medication? No, not enough to last until her next appointment. She had to reschedule due to a mandatory work meeting.  Has the patient been seen for an appointment in the last year OR does the patient have an upcoming appointment? Yes  Can we respond through MyChart? Yes  Agent: Please be advised that Rx refills may take up to 3 business days. We ask that you follow-up with your pharmacy.

## 2023-10-19 MED ORDER — LOSARTAN POTASSIUM-HCTZ 100-12.5 MG PO TABS: 1.0000 | ORAL_TABLET | Freq: Every day | ORAL | 0 refills | Status: DC

## 2023-10-19 NOTE — Telephone Encounter (Signed)
 Requested Prescriptions  Pending Prescriptions Disp Refills   losartan-hydrochlorothiazide (HYZAAR) 100-12.5 MG tablet 90 tablet 0    Sig: Take 1 tablet by mouth daily.     Cardiovascular: ARB + Diuretic Combos Failed - 10/19/2023  1:00 PM      Failed - K in normal range and within 180 days    Potassium  Date Value Ref Range Status  08/24/2022 3.3 (L) 3.5 - 5.2 mmol/L Final         Failed - Na in normal range and within 180 days    Sodium  Date Value Ref Range Status  08/24/2022 139 134 - 144 mmol/L Final         Failed - Cr in normal range and within 180 days    Creatinine  Date Value Ref Range Status  07/09/2014 0.84 0.60 - 1.30 mg/dL Final   Creatinine, Ser  Date Value Ref Range Status  08/24/2022 0.68 0.57 - 1.00 mg/dL Final         Failed - eGFR is 10 or above and within 180 days    EGFR (African American)  Date Value Ref Range Status  07/09/2014 >60 >43mL/min Final  03/28/2014 >60  Final   GFR calc Af Amer  Date Value Ref Range Status  05/03/2019 96 >59 mL/min/1.73 Final   EGFR (Non-African Amer.)  Date Value Ref Range Status  07/09/2014 >60 >69mL/min Final    Comment:    eGFR values <42mL/min/1.73 m2 may be an indication of chronic kidney disease (CKD). Calculated eGFR, using the MRDR Study equation, is useful in  patients with stable renal function. The eGFR calculation will not be reliable in acutely ill patients when serum creatinine is changing rapidly. It is not useful in patients on dialysis. The eGFR calculation may not be applicable to patients at the low and high extremes of body sizes, pregnant women, and vegetarians.   03/28/2014 >60  Final    Comment:    eGFR values <20mL/min/1.73 m2 may be an indication of chronic kidney disease (CKD). Calculated eGFR is useful in patients with stable renal function. The eGFR calculation will not be reliable in acutely ill patients when serum creatinine is changing rapidly. It is not useful in  patients  on dialysis. The eGFR calculation may not be applicable to patients at the low and high extremes of body sizes, pregnant women, and vegetarians.    GFR calc non Af Amer  Date Value Ref Range Status  05/03/2019 83 >59 mL/min/1.73 Final   eGFR  Date Value Ref Range Status  08/24/2022 103 >59 mL/min/1.73 Final         Failed - Valid encounter within last 6 months    Recent Outpatient Visits           7 months ago Essential hypertension   [facility] Alba Cory, MD   1 year ago Left-sided chest pain   [place] [facility] Alba Cory, MD   1 year ago Recurrent acute deep vein thrombosis (DVT) of left lower extremity [facility])   [place] [facility] Alba Cory, MD   1 year ago Well adult exam   [facility] Alba Cory, MD   2 years ago Asthma, moderate persistent, well-controlled    [facility] Alba Cory, MD              Passed - Patient is not pregnant      Passed - Last BP  in normal range    BP Readings from Last 1 Encounters:  03/20/23 134/78

## 2023-10-28 LAB — CBC WITH DIFFERENTIAL/PLATELET
Basophils Absolute: 0.1 10*3/uL (ref 0.0–0.2)
Basos: 1 %
EOS (ABSOLUTE): 0.2 10*3/uL (ref 0.0–0.4)
Eos: 4 %
Hematocrit: 38.2 % (ref 34.0–46.6)
Hemoglobin: 12.5 g/dL (ref 11.1–15.9)
Immature Grans (Abs): 0 10*3/uL (ref 0.0–0.1)
Immature Granulocytes: 0 %
Lymphocytes Absolute: 2.5 10*3/uL (ref 0.7–3.1)
Lymphs: 40 %
MCH: 28.9 pg (ref 26.6–33.0)
MCHC: 32.7 g/dL (ref 31.5–35.7)
MCV: 88 fL (ref 79–97)
Monocytes Absolute: 0.4 10*3/uL (ref 0.1–0.9)
Monocytes: 7 %
Neutrophils Absolute: 2.9 10*3/uL (ref 1.4–7.0)
Neutrophils: 48 %
Platelets: 233 10*3/uL (ref 150–450)
RBC: 4.32 x10E6/uL (ref 3.77–5.28)
RDW: 13.1 % (ref 11.7–15.4)
WBC: 6.1 10*3/uL (ref 3.4–10.8)

## 2023-10-28 LAB — HEMOGLOBIN A1C
Est. average glucose Bld gHb Est-mCnc: 117 mg/dL
Hgb A1c MFr Bld: 5.7 % — ABNORMAL HIGH (ref 4.8–5.6)

## 2023-10-28 LAB — LIPID PANEL
Chol/HDL Ratio: 2.8 ratio (ref 0.0–4.4)
Cholesterol, Total: 210 mg/dL — ABNORMAL HIGH (ref 100–199)
HDL: 74 mg/dL (ref >39)
LDL Chol Calc (NIH): 125 mg/dL — ABNORMAL HIGH (ref 0–99)
Triglycerides: 63 mg/dL (ref 0–149)
VLDL Cholesterol Cal: 11 mg/dL (ref 5–40)

## 2023-10-28 LAB — COMP. METABOLIC PANEL (12)
AST: 20 IU/L (ref 0–40)
Albumin: 4.4 g/dL (ref 3.8–4.9)
Alkaline Phosphatase: 68 IU/L (ref 44–121)
BUN/Creatinine Ratio: 16 (ref 9–23)
BUN: 15 mg/dL (ref 6–24)
Bilirubin Total: 0.4 mg/dL (ref 0.0–1.2)
Calcium: 9.9 mg/dL (ref 8.7–10.2)
Chloride: 101 mmol/L (ref 96–106)
Creatinine, Ser: 0.94 mg/dL (ref 0.57–1.00)
Globulin, Total: 2.7 g/dL (ref 1.5–4.5)
Glucose: 90 mg/dL (ref 70–99)
Potassium: 3.6 mmol/L (ref 3.5–5.2)
Sodium: 141 mmol/L (ref 134–144)
Total Protein: 7.1 g/dL (ref 6.0–8.5)
eGFR: 71 mL/min/{1.73_m2} (ref >59)

## 2023-10-28 LAB — TSH: TSH: 1.48 u[IU]/mL (ref 0.450–4.500)

## 2023-10-28 LAB — B12 AND FOLATE PANEL
Folate: >20 ng/mL (ref >3.0)
Vitamin B-12: 541 pg/mL (ref 232–1245)

## 2023-10-28 LAB — VITAMIN D 25 HYDROXY (VIT D DEFICIENCY, FRACTURES): Vit D, 25-Hydroxy: 55.4 ng/mL (ref 30.0–100.0)

## 2023-10-30 ENCOUNTER — Encounter: Payer: Self-pay | Admitting: Family Medicine

## 2023-11-07 ENCOUNTER — Encounter: Payer: Self-pay | Admitting: Family Medicine

## 2023-11-07 ENCOUNTER — Ambulatory Visit: Payer: Self-pay | Admitting: Family Medicine

## 2023-11-07 VITALS — BP 124/82 | HR 96 | Resp 16 | Ht 65.0 in | Wt 191.5 lb

## 2023-11-07 DIAGNOSIS — M25511 Pain in right shoulder: Secondary | ICD-10-CM

## 2023-11-07 DIAGNOSIS — J4540 Moderate persistent asthma, uncomplicated: Secondary | ICD-10-CM

## 2023-11-07 DIAGNOSIS — K219 Gastro-esophageal reflux disease without esophagitis: Secondary | ICD-10-CM | POA: Insufficient documentation

## 2023-11-07 DIAGNOSIS — G8929 Other chronic pain: Secondary | ICD-10-CM | POA: Insufficient documentation

## 2023-11-07 DIAGNOSIS — Z1231 Encounter for screening mammogram for malignant neoplasm of breast: Secondary | ICD-10-CM

## 2023-11-07 DIAGNOSIS — E785 Hyperlipidemia, unspecified: Secondary | ICD-10-CM | POA: Insufficient documentation

## 2023-11-07 DIAGNOSIS — I10 Essential (primary) hypertension: Secondary | ICD-10-CM

## 2023-11-07 DIAGNOSIS — J3089 Other allergic rhinitis: Secondary | ICD-10-CM | POA: Diagnosis not present

## 2023-11-07 DIAGNOSIS — J302 Other seasonal allergic rhinitis: Secondary | ICD-10-CM

## 2023-11-07 DIAGNOSIS — I82502 Chronic embolism and thrombosis of unspecified deep veins of left lower extremity: Secondary | ICD-10-CM | POA: Diagnosis not present

## 2023-11-07 DIAGNOSIS — E66811 Obesity, class 1: Secondary | ICD-10-CM | POA: Insufficient documentation

## 2023-11-07 MED ORDER — SEMAGLUTIDE-WEIGHT MANAGEMENT 0.25 MG/0.5ML ~~LOC~~ SOAJ: 0.2500 mg | SUBCUTANEOUS | 0 refills | Status: DC

## 2023-11-07 MED ORDER — LOSARTAN POTASSIUM-HCTZ 100-12.5 MG PO TABS: 1.0000 | ORAL_TABLET | Freq: Every day | ORAL | 1 refills | Status: DC

## 2023-11-07 MED ORDER — MONTELUKAST SODIUM 10 MG PO TABS: 10.0000 mg | ORAL_TABLET | Freq: Every day | ORAL | 1 refills | Status: DC

## 2023-11-07 MED ORDER — SEMAGLUTIDE-WEIGHT MANAGEMENT 0.5 MG/0.5ML ~~LOC~~ SOAJ: 0.5000 mg | SUBCUTANEOUS | 0 refills | Status: DC

## 2023-11-07 MED ORDER — HYDRALAZINE HCL 10 MG PO TABS: 10.0000 mg | ORAL_TABLET | Freq: Three times a day (TID) | ORAL | 0 refills | Status: DC | PRN

## 2023-11-07 NOTE — Progress Notes (Signed)
 Name: [name] [name]   MRN: [id number]    DOB: 1966-06-06   Date:11/07/2023       Progress Note  Subjective  Chief Complaint  Chief Complaint  Patient presents with   Medical Management of Chronic Issues   Discussed the use of AI scribe software for clinical note transcription with the patient, who gave verbal consent to proceed.  History of Present Illness [name] [name] is a 58 year old female with obesity and hypertension who presents for a follow-up visit.  She is interested in weight loss aids due to her obesity, with a BMI over 30. Despite consistent exercise since February, she has experienced weight gain, which she attributes to muscle toning. Recent blood work revealed an elevated A1c in the prediabetes range and stable but elevated LDL cholesterol.  She is currently taking losartan for hypertension, which is generally effective, though she occasionally experiences episodes of elevated blood pressure associated with allergy symptoms. She takes an 81 mg aspirin daily for her history of deep vein thrombosis, having had three episodes in the past, but is not on any other anticoagulation therapy.  Her asthma is managed with daily use of Breo, loratadine, and montelukast, effectively controlling her symptoms. No recent wheezing or shortness of breath.  She experiences chronic right shoulder pain, suspected to be due to a torn rotator cuff. She has not pursued surgery and uses Celebrex as needed for pain management, reporting limited range of motion and pain with certain movements.  She reports stress related to her father's new relationship following the loss of her mother three years ago. She feels protective of her father and is concerned about meeting his new partner during an upcoming visit.  She describes work-related stress due to recent restructuring at her job, resulting in a change of her reporting structure and a reduction in her territory. She notes having less  driving and more free time, which she is adjusting to.    Patient Active Problem List   Diagnosis Date Noted   Chronic deep vein thrombosis (DVT) of left lower extremity (HCC) 05/03/2019   Hypertension 09/11/2017   Adhesive capsulitis of right shoulder 05/12/2017   Perennial allergic rhinitis with seasonal variation 07/02/2015   Hypokalemia 07/02/2015    Past Surgical History:  Procedure Laterality Date   INSERTION OF VENA CAVA FILTER  04/2014   Dr. Wyn Quaker    IVC FILTER REMOVAL N/A 12/29/2016   Procedure: IVC Filter Removal;  Surgeon: Annice Needy, MD;  Location: [facility] INVASIVE CV LAB;  Service: Cardiovascular;  Laterality: N/A;   PERIPHERAL VASCULAR THROMBECTOMY Left 11/01/2016   Procedure: Peripheral Vascular Thrombectomy;  Surgeon: Annice Needy, MD;  Location: [facility] INVASIVE CV LAB;  Service: Cardiovascular;  Laterality: Left;   TUBAL LIGATION      Family History  Problem Relation Age of Onset   Diabetes Mother    Hypertension Mother    Hypercalcemia Father    Leukemia Son 3   Diabetes Maternal Grandmother    Heart disease Paternal Grandfather     Social History   Tobacco Use   Smoking status: Former    Current packs/day: 0.00    Average packs/day: 0.5 packs/day for 10.0 years (5.0 ttl pk-yrs)    Types: Cigarettes    Start date: 08/01/1998    Quit date: 08/01/2008    Years since quitting: 15.2   Smokeless tobacco: Never  Substance Use Topics   Alcohol use: Not Currently    Alcohol/week: 0.0 standard drinks of  alcohol    Comment: many years ago     Current Outpatient Medications:    acetaminophen (TYLENOL) 500 MG tablet, Take 1,000 mg by mouth 2 (two) times daily as needed for moderate pain., Disp: , Rfl:    albuterol (VENTOLIN HFA) 108 (90 Base) MCG/ACT inhaler, INHALE 2 PUFFS BY MOUTH EVERY 6 HOURS AS NEEDED FOR WHEEZING OR SHORTNESS OF BREATH, Disp: 25.5 g, Rfl: 1   celecoxib (CELEBREX) 100 MG capsule, Take 1 capsule (100 mg total) by mouth 2 (two) times daily as needed.,  Disp: 180 capsule, Rfl: 0   fluticasone furoate-vilanterol (BREO ELLIPTA) 100-25 MCG/ACT AEPB, Inhale 1 puff into the lungs daily., Disp: 3 each, Rfl: 1   loratadine (CLARITIN) 10 MG tablet, Take 1 tablet (10 mg total) by mouth daily., Disp: 30 tablet, Rfl: 5   losartan-hydrochlorothiazide (HYZAAR) 100-12.5 MG tablet, Take 1 tablet by mouth daily., Disp: 90 tablet, Rfl: 0   montelukast (SINGULAIR) 10 MG tablet, Take 1 tablet (10 mg total) by mouth at bedtime., Disp: 90 tablet, Rfl: 1   Multiple Vitamin (MULTIVITAMIN WITH MINERALS) TABS tablet, Take 1 tablet by mouth daily., Disp: , Rfl:   No Known Allergies  I personally reviewed active problem list, medication list, allergies with the patient/caregiver today.   ROS  Ten systems reviewed and is negative except as mentioned in HPI    Objective Physical Exam  Constitutional: Patient appears well-developed and well-nourished. Obese  No distress.  HEENT: head atraumatic, normocephalic, pupils equal and reactive to light, neck supple Cardiovascular: Normal rate, regular rhythm and normal heart sounds.  No murmur heard. No BLE edema. Pulmonary/Chest: Effort normal and breath sounds normal. No respiratory distress. Abdominal: Soft.  There is no tenderness. Psychiatric: Patient has a normal mood and affect. behavior is normal. Judgment and thought content normal.   Vitals:   11/07/23 1548  BP: 124/82  Pulse: 96  Resp: 16  SpO2: 97%  Weight: 191 lb 8 oz (86.9 kg)  Height: 5\' 5"  (1.651 m)    Body mass index is 31.87 kg/m.  Recent Results (from the past 2160 hours)  B12 and Folate Panel     Status: None   Collection Time: 10/27/23  9:47 AM  Result Value Ref Range   Vitamin B-12 541 232 - 1,245 pg/mL   Folate >20.0 >3.0 ng/mL    Comment: A serum folate concentration of less than 3.1 ng/mL is considered to represent clinical deficiency.   CBC with Differential/Platelet     Status: None   Collection Time: 10/27/23  9:47 AM  Result  Value Ref Range   WBC 6.1 3.4 - 10.8 x10E3/uL   RBC 4.32 3.77 - 5.28 x10E6/uL   Hemoglobin 12.5 11.1 - 15.9 g/dL   Hematocrit 16.1 09.6 - 46.6 %   MCV 88 79 - 97 fL   MCH 28.9 26.6 - 33.0 pg   MCHC 32.7 31.5 - 35.7 g/dL   RDW 04.5 40.9 - 81.1 %   Platelets 233 150 - 450 x10E3/uL   Neutrophils 48 Not Estab. %   Lymphs 40 Not Estab. %   Monocytes 7 Not Estab. %   Eos 4 Not Estab. %   Basos 1 Not Estab. %   Neutrophils Absolute 2.9 1.4 - 7.0 x10E3/uL   Lymphocytes Absolute 2.5 0.7 - 3.1 x10E3/uL   Monocytes Absolute 0.4 0.1 - 0.9 x10E3/uL   EOS (ABSOLUTE) 0.2 0.0 - 0.4 x10E3/uL   Basophils Absolute 0.1 0.0 - 0.2 x10E3/uL   Immature  Granulocytes 0 Not Estab. %   Immature Grans (Abs) 0.0 0.0 - 0.1 x10E3/uL  Hemoglobin A1c     Status: Abnormal   Collection Time: 10/27/23  9:47 AM  Result Value Ref Range   Hgb A1c MFr Bld 5.7 (H) 4.8 - 5.6 %    Comment:          Prediabetes: 5.7 - 6.4          Diabetes: >6.4          Glycemic control for adults with diabetes: <7.0    Est. average glucose Bld gHb Est-mCnc 117 mg/dL  VITAMIN D 25 Hydroxy (Vit-D Deficiency, Fractures)     Status: None   Collection Time: 10/27/23  9:47 AM  Result Value Ref Range   Vit D, 25-Hydroxy 55.4 30.0 - 100.0 ng/mL    Comment: Vitamin D deficiency has been defined by the Institute of Medicine and an Endocrine Society practice guideline as a level of serum 25-OH vitamin D less than 20 ng/mL (1,2). The Endocrine Society went on to further define vitamin D insufficiency as a level between 21 and 29 ng/mL (2). 1. IOM (Institute of Medicine). 2010. Dietary reference    intakes for calcium and D. [place]: The    [employer]. 2. Holick MF, [place], Bischoff-Ferrari HA, et al.    Evaluation, treatment, and prevention of vitamin D    deficiency: an Endocrine Society clinical practice    guideline. JCEM. 2011 Jul; 96(7):1911-30.   TSH     Status: None   Collection Time: 10/27/23  9:47 AM   Result Value Ref Range   TSH 1.480 0.450 - 4.500 uIU/mL  Lipid panel     Status: Abnormal   Collection Time: 10/27/23  9:47 AM  Result Value Ref Range   Cholesterol, Total 210 (H) 100 - 199 mg/dL   Triglycerides 63 0 - 149 mg/dL   HDL 74 >16 mg/dL   VLDL Cholesterol Cal 11 5 - 40 mg/dL   LDL Chol Calc (NIH) 109 (H) 0 - 99 mg/dL   Chol/HDL Ratio 2.8 0.0 - 4.4 ratio    Comment:                                   T. Chol/HDL Ratio                                             Men  Women                               1/2 Avg.Risk  3.4    3.3                                   Avg.Risk  5.0    4.4                                2X Avg.Risk  9.6    7.1  3X Avg.Risk 23.4   11.0   Comp. Metabolic Panel (12)     Status: None   Collection Time: 10/27/23  9:47 AM  Result Value Ref Range   Glucose 90 70 - 99 mg/dL   BUN 15 6 - 24 mg/dL   Creatinine, Ser 4.09 0.57 - 1.00 mg/dL   eGFR 71 >81 XB/JYN/8.29   BUN/Creatinine Ratio 16 9 - 23   Sodium 141 134 - 144 mmol/L   Potassium 3.6 3.5 - 5.2 mmol/L   Chloride 101 96 - 106 mmol/L   Calcium 9.9 8.7 - 10.2 mg/dL   Total Protein 7.1 6.0 - 8.5 g/dL   Albumin 4.4 3.8 - 4.9 g/dL   Globulin, Total 2.7 1.5 - 4.5 g/dL   Bilirubin Total 0.4 0.0 - 1.2 mg/dL   Alkaline Phosphatase 68 44 - 121 IU/L   AST 20 0 - 40 IU/L    Diabetic Foot Exam:     PHQ2/9:    11/07/2023    3:37 PM 03/20/2023    2:51 PM 08/26/2022   10:53 AM 08/17/2022   11:31 AM 03/09/2022    2:06 PM  Depression screen PHQ 2/9  Decreased Interest 0 0 0 0 0  Down, Depressed, Hopeless 0 0 0 0 0  PHQ - 2 Score 0 0 0 0 0  Altered sleeping 0 0 0 0 0  Tired, decreased energy 0 0 0 0 1  Change in appetite 0 0 0 0 0  Feeling bad or failure about yourself  0 0 0 0 0  Trouble concentrating 0 0 0 0 0  Moving slowly or fidgety/restless 0 0 0 0 0  Suicidal thoughts 0 0 0 0 0  PHQ-9 Score 0 0 0 0 1  Difficult doing work/chores Not difficult at all    Not  difficult at all    phq 9 is negative  Fall Risk:    03/20/2023    2:51 PM 08/26/2022   10:53 AM 08/17/2022   11:31 AM 03/09/2022    1:37 PM 07/14/2021    2:37 PM  Fall Risk   Falls in the past year? 0 0 0 0 0  Number falls in past yr: 0  0    Injury with Fall? 0  0    Risk for fall due to : No Fall Risks No Fall Risks No Fall Risks No Fall Risks   Follow up Falls prevention discussed Falls prevention discussed Falls prevention discussed Falls prevention discussed;Education provided;Falls evaluation completed Falls prevention discussed     Assessment and Plan Assessment & Plan Obesity BMI over 30. Interested in [place] for weight loss. A1c in prediabetes range. Slightly elevated cholesterol. Insurance coverage for weight loss medication uncertain. - Send prescription for [employer]. She denies family history of thyroid cancer or personal history of pancreatitis - Instruct her to call insurance to check coverage for weight loss medication. - If Reginal Lutes is not covered, consider Contrave or metformin as alternatives.  Prediabetes A1c is 5.7. No diabetes symptoms. Metformin may be considered if weight loss medications are not covered.  Hypertension Losartan effectively managing blood pressure. Hydralazine prescribed as needed for blood pressure over 150/90 mmHg. - Prescribe hydralazine as needed if blood pressure exceeds 150/90 mmHg.  Hyperlipidemia LDL cholesterol is 125, remains elevated.  Asthma Montelukast, loratadine, and Breo effectively control symptoms. No wheezing or shortness of breath. - Advise her to rinse mouth after using Breo to prevent thrush.  Chronic Right Shoulder Pain Chronic pain possibly  due to torn rotator cuff. Uses Celebrex as needed. Surgery not opted. - Discuss the option of physical therapy. - Consider referral to orthopedics if needed.  Chronic Deep Vein Thrombosis (DVT) Multiple DVTs. Not on anticoagulation, stopped on her own years ago. Takes 81 mg  aspirin daily.  General Health Maintenance Due for mammogram. Needs new facility for procedure. - Order mammogram at external med center in [place].  Follow-up Advised to schedule physical and follow-up in six months. - Schedule physical exam. - Schedule six-month follow-up appointment.

## 2023-11-08 ENCOUNTER — Telehealth: Payer: Self-pay

## 2023-11-08 NOTE — Telephone Encounter (Unsigned)
 Copied from CRM [id number]. Topic: Clinical - Prescription Issue >> Nov 08, 2023  2:59 PM [name] wrote: Reason for CRM: Patient called stated pharmacy says they will not cover emaglutide-Weight Management 0.25 MG/0.5ML SOAJ. Unsure if PA required or if they will not cover it at all. Patient states provider told her if not covered there is another medication she can prescribe for less cost. Patient would like to see if provider can get approval. IF not she would like to try to alternative. Can be reached via phone or MyChart. Thank You

## 2023-11-09 ENCOUNTER — Telehealth: Payer: Self-pay | Admitting: Family Medicine

## 2023-11-09 NOTE — Telephone Encounter (Signed)
 Patient needs to upload new insurance card to proceed

## 2023-11-09 NOTE — Telephone Encounter (Signed)
 Prior Auth from [employer] 0.25 MG/0.5ML SOAJ    Key: [id number]

## 2023-11-09 NOTE — Telephone Encounter (Signed)
 I called patient to let her know I need a copy of her insurance of this year due to [employer] stating member not found with 2024 insurance card.

## 2023-11-10 NOTE — Telephone Encounter (Signed)
 Reached out to patient advised her in cover my meds states patient not found. Advised patient to reach out to insurance and see if they can fax over paperwork for PA for wegovy or to let me know hoe they have her on there system cause it is not working on my end.

## 2023-11-29 ENCOUNTER — Other Ambulatory Visit: Payer: Self-pay | Admitting: Family Medicine

## 2023-11-29 ENCOUNTER — Telehealth: Payer: Self-pay

## 2023-11-29 DIAGNOSIS — E66811 Obesity, class 1: Secondary | ICD-10-CM

## 2023-11-29 MED ORDER — CONTRAVE 8-90 MG PO TB12: ORAL_TABLET | ORAL | 0 refills | Status: DC

## 2023-11-29 NOTE — Telephone Encounter (Signed)
 Copied from CRM [id number]. Topic: Clinical - Prescription Issue >> Nov 24, 2023 11:25 AM Everlene Hobby D wrote: Calling to get update on her Wegovy  and says she needs a pre authorization for it to be covered by insurance >> Nov 29, 2023  2:20 PM Lizabeth Riggs wrote: This is for Renteria-Garcia, Hanish Laraia, CMA: Kierstyn is calling back to get an update on the prior authorization. Please call her at [phone]. Thanks >> Nov 24, 2023 11:48 AM CMA Blu Mcglaun R wrote: Advised patient still waiting on hearing from insurance. Pt provided me with fax number they gave her [phone], will also fax it tot hat number and see if I can get quicker response based on her wegovy  rx.

## 2023-11-29 NOTE — Telephone Encounter (Signed)
 I advised patient have not heard form the 2 times I have faxed it to insurance. Patient states she is not willing to wait and would like to try contrave as per previous OV note.

## 2024-02-23 ENCOUNTER — Encounter: Admitting: Family Medicine

## 2024-05-08 ENCOUNTER — Ambulatory Visit: Admitting: Family Medicine

## 2024-07-08 ENCOUNTER — Ambulatory Visit: Admitting: Family Medicine

## 2024-07-08 ENCOUNTER — Encounter: Payer: Self-pay | Admitting: Family Medicine

## 2024-07-08 VITALS — BP 126/74 | HR 84 | Resp 16 | Ht 65.0 in | Wt 188.5 lb

## 2024-07-08 DIAGNOSIS — I10 Essential (primary) hypertension: Secondary | ICD-10-CM | POA: Diagnosis not present

## 2024-07-08 DIAGNOSIS — J3089 Other allergic rhinitis: Secondary | ICD-10-CM | POA: Diagnosis not present

## 2024-07-08 DIAGNOSIS — J40 Bronchitis, not specified as acute or chronic: Secondary | ICD-10-CM

## 2024-07-08 DIAGNOSIS — M25511 Pain in right shoulder: Secondary | ICD-10-CM

## 2024-07-08 DIAGNOSIS — Z8261 Family history of arthritis: Secondary | ICD-10-CM

## 2024-07-08 DIAGNOSIS — I82502 Chronic embolism and thrombosis of unspecified deep veins of left lower extremity: Secondary | ICD-10-CM | POA: Diagnosis not present

## 2024-07-08 DIAGNOSIS — J4541 Moderate persistent asthma with (acute) exacerbation: Secondary | ICD-10-CM

## 2024-07-08 DIAGNOSIS — Z1211 Encounter for screening for malignant neoplasm of colon: Secondary | ICD-10-CM

## 2024-07-08 DIAGNOSIS — E66811 Obesity, class 1: Secondary | ICD-10-CM

## 2024-07-08 DIAGNOSIS — G8929 Other chronic pain: Secondary | ICD-10-CM

## 2024-07-08 DIAGNOSIS — J302 Other seasonal allergic rhinitis: Secondary | ICD-10-CM

## 2024-07-08 DIAGNOSIS — K219 Gastro-esophageal reflux disease without esophagitis: Secondary | ICD-10-CM

## 2024-07-08 MED ORDER — CELECOXIB 100 MG PO CAPS: 100.0000 mg | ORAL_CAPSULE | Freq: Two times a day (BID) | ORAL | 0 refills | Status: AC | PRN

## 2024-07-08 MED ORDER — AIRSUPRA 90-80 MCG/ACT IN AERO: 2.0000 | INHALATION_SPRAY | Freq: Four times a day (QID) | RESPIRATORY_TRACT | 2 refills | Status: AC

## 2024-07-08 MED ORDER — LOSARTAN POTASSIUM-HCTZ 100-12.5 MG PO TABS: 1.0000 | ORAL_TABLET | Freq: Every day | ORAL | 1 refills | Status: AC

## 2024-07-08 MED ORDER — METHYLPREDNISOLONE 4 MG PO TBPK: ORAL_TABLET | ORAL | 0 refills | Status: AC

## 2024-07-08 MED ORDER — AZITHROMYCIN 250 MG PO TABS: ORAL_TABLET | ORAL | 0 refills | Status: AC

## 2024-07-08 MED ORDER — FLUTICASONE FUROATE-VILANTEROL 100-25 MCG/ACT IN AEPB: 1.0000 | INHALATION_SPRAY | Freq: Every day | RESPIRATORY_TRACT | 1 refills | Status: AC

## 2024-07-08 MED ORDER — MONTELUKAST SODIUM 10 MG PO TABS: 10.0000 mg | ORAL_TABLET | Freq: Every day | ORAL | 1 refills | Status: AC

## 2024-07-08 MED ORDER — WEGOVY 0.25 MG/0.5ML ~~LOC~~ SOAJ: 0.2500 mg | SUBCUTANEOUS | 0 refills | Status: AC

## 2024-07-08 MED ORDER — HYDRALAZINE HCL 10 MG PO TABS: 10.0000 mg | ORAL_TABLET | Freq: Three times a day (TID) | ORAL | 0 refills | Status: AC | PRN

## 2024-07-08 NOTE — Progress Notes (Signed)
 Name: [name] [name]   MRN: [id number]    DOB: [birth year]   Date:07/08/2024       Progress Note  Subjective  Chief Complaint  Chief Complaint  Patient presents with   Medical Management of Chronic Issues   Discussed the use of AI scribe software for clinical note transcription with the patient, who gave verbal consent to proceed.  History of Present Illness [name] [name] is a 58 year old female with asthma and hypertension who presents with a cough and chest discomfort.  She has been experiencing a productive cough for the past few days, accompanied by chest aching. Her asthma and bronchitis history includes exacerbations twice a year, but symptoms have been better controlled since starting Singulair  at night and Claritin  during the day. She currently uses albuterol  and Breo inhalers, with Breo being used daily. No fever, chills, runny nose, or congestion are present. She has moderate persistent asthma requiring year-round medication and mentions that prednisone  helps when she has flares but dislikes the associated weight gain.  Her hypertension is managed with losartan  HCTZ 100/12.5 mg once daily. She also uses hydralazine  as needed for blood pressure spikes, which she attributes to physical therapy for her shoulder pain.  She experiences chronic right shoulder pain, worse than the left, and has received a cortisone shot in the past, which elevated her blood pressure. She is undergoing physical therapy and has been prescribed Celebrex  for flare-ups. She reports difficulty lifting her arm and performing tasks like doing her hair.  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 She was on Xarelto  for three years but stopped in 2020 on her own. She currently takes aspirin daily and has not had any recent DVT issues.  She is dealing with obesity, with a BMI of 37, and has tried Weight Watchers and treadmill exercises for weight loss. She  reports a weight reduction from 192 to 188.5 pounds since last visit but would like to lose more She is interested in trying GLP-1 agonist. She was given Contrave  in the past but was afraid of side effects No family history of thyroid cancer or personal history of pancreatitis   Dyslipidemia  The 10-year ASCVD risk score (Arnett DK, et al., 2019) is: 4.4%   Values used to calculate the score:     Age: 65 years     Clincally relevant sex: Female     Is Non-Hispanic African American: Yes     Diabetic: No     Tobacco smoker: No     Systolic Blood Pressure: 126 mmHg     Is BP treated: Yes     HDL Cholesterol: 74 mg/dL     Total Cholesterol: 210 mg/dL    Patient Active Problem List   Diagnosis Date Noted   Dyslipidemia 11/07/2023   Chronic right shoulder pain 11/07/2023   Obesity (BMI 30.0-34.9) 11/07/2023   GERD without esophagitis 11/07/2023   Chronic deep vein thrombosis (DVT) of left lower extremity (HCC) 05/03/2019   Essential hypertension 09/11/2017   Adhesive capsulitis of right shoulder 05/12/2017   Asthma, well controlled 07/02/2015   Perennial allergic rhinitis with seasonal variation 07/02/2015   Hypokalemia 07/02/2015    Past Surgical History:  Procedure Laterality Date   INSERTION OF VENA CAVA FILTER  04/2014   Dr. Marea    IVC FILTER REMOVAL N/A 12/29/2016   Procedure: IVC Filter Removal;  Surgeon: Marea Selinda RAMAN, MD;  Location: [facility];  Service: Cardiovascular;  Laterality: N/A;   PERIPHERAL VASCULAR THROMBECTOMY Left 11/01/2016   Procedure: Peripheral Vascular Thrombectomy;  Surgeon: Selinda GORMAN Gu, MD;  Location: [facility];  Service: Cardiovascular;  Laterality: Left;   TUBAL LIGATION      Family History  Problem Relation Age of Onset   Diabetes Mother    Hypertension Mother    Hypercalcemia Father    Leukemia Son 3   Diabetes Maternal Grandmother    Heart disease Paternal Grandfather     Social History   Tobacco Use   Smoking status:  Former    Current packs/day: 0.00    Average packs/day: 0.5 packs/day for 10.0 years (5.0 ttl pk-yrs)    Types: Cigarettes    Start date: 08/01/1998    Quit date: 08/01/2008    Years since quitting: 15.9   Smokeless tobacco: Never  Substance Use Topics   Alcohol use: Not Currently    Alcohol/week: 0.0 standard drinks of alcohol    Comment: many years ago     Current Outpatient Medications:    acetaminophen  (TYLENOL ) 500 MG tablet, Take 1,000 mg by mouth 2 (two) times daily as needed for moderate pain., Disp: , Rfl:    albuterol  (VENTOLIN  HFA) 108 (90 Base) MCG/ACT inhaler, INHALE 2 PUFFS BY MOUTH EVERY 6 HOURS AS NEEDED FOR WHEEZING OR SHORTNESS OF BREATH, Disp: 25.5 g, Rfl: 1   aspirin 81 MG chewable tablet, Chew 81 mg by mouth daily., Disp: , Rfl:    celecoxib  (CELEBREX ) 100 MG capsule, Take 1 capsule (100 mg total) by mouth 2 (two) times daily as needed., Disp: 180 capsule, Rfl: 0   fluticasone  furoate-vilanterol (BREO ELLIPTA ) 100-25 MCG/ACT AEPB, Inhale 1 puff into the lungs daily., Disp: 3 each, Rfl: 1   hydrALAZINE  (APRESOLINE ) 10 MG tablet, Take 1 tablet (10 mg total) by mouth 3 (three) times daily as needed. If bp goes above 150/90, Disp: 30 tablet, Rfl: 0   loratadine  (CLARITIN ) 10 MG tablet, Take 1 tablet (10 mg total) by mouth daily., Disp: 30 tablet, Rfl: 5   losartan -hydrochlorothiazide  (HYZAAR) 100-12.5 MG tablet, Take 1 tablet by mouth daily., Disp: 90 tablet, Rfl: 1   montelukast  (SINGULAIR ) 10 MG tablet, Take 1 tablet (10 mg total) by mouth at bedtime., Disp: 90 tablet, Rfl: 1   Multiple Vitamin (MULTIVITAMIN WITH MINERALS) TABS tablet, Take 1 tablet by mouth daily., Disp: , Rfl:    Naltrexone-buPROPion HCl ER (CONTRAVE ) 8-90 MG TB12, Start 1 tablet every morning for 7 days, then 1 tablet twice daily for 7 days, then 2 tablets every morning and one every evening, Disp: 120 tablet, Rfl: 0  No Known Allergies  I personally reviewed active problem list, medication list,  allergies, family history, social history with the patient/caregiver today.   ROS  Ten systems reviewed and is negative except as mentioned in HPI    Objective Physical Exam  CONSTITUTIONAL: Patient appears well-developed and well-nourished. No distress. HEENT: Head atraumatic, normocephalic, neck supple. CARDIOVASCULAR: Normal rate, regular rhythm and normal heart sounds. No murmur heard. No BLE edema. PULMONARY: Effort normal and breath sounds normal. No respiratory distress. No wheeze. ABDOMINAL: There is no tenderness or distention. MUSCULOSKELETAL: Normal gait. Without gross motor or sensory deficit. PSYCHIATRIC: Patient has a normal mood and affect. Behavior is normal. Judgment and thought content normal.  Vitals:   07/08/24 1210  BP: 126/74  Pulse: 84  Resp: 16  SpO2: 99%  Weight: 188 lb 8 oz (85.5 kg)  Height: 5'  5 (1.651 m)    Body mass index is 31.37 kg/m.    PHQ2/9:    07/08/2024   12:06 PM 11/07/2023    3:37 PM 03/20/2023    2:51 PM 08/26/2022   10:53 AM 08/17/2022   11:31 AM  Depression screen PHQ 2/9  Decreased Interest 0 0 0 0 0  Down, Depressed, Hopeless 0 0 0 0 0  PHQ - 2 Score 0 0 0 0 0  Altered sleeping  0 0 0 0  Tired, decreased energy  0 0 0 0  Change in appetite  0 0 0 0  Feeling bad or failure about yourself   0 0 0 0  Trouble concentrating  0 0 0 0  Moving slowly or fidgety/restless  0 0 0 0  Suicidal thoughts  0 0 0 0  PHQ-9 Score  0  0  0  0   Difficult doing work/chores  Not difficult at all        Data saved with a previous flowsheet row definition    phq 9 is negative  Fall Risk:    07/08/2024   12:06 PM 03/20/2023    2:51 PM 08/26/2022   10:53 AM 08/17/2022   11:31 AM 03/09/2022    1:37 PM  Fall Risk   Falls in the past year? 0 0 0 0 0  Number falls in past yr: 0 0  0   Injury with Fall? 0 0   0    Risk for fall due to : No Fall Risks No Fall Risks No Fall Risks No Fall Risks No Fall Risks  Follow up Falls evaluation completed  Falls prevention discussed Falls prevention discussed Falls prevention discussed  Falls prevention discussed;Education provided;Falls evaluation completed      Data saved with a previous flowsheet row definition     Assessment and Plan Assessment & Plan Moderate persistent asthma with acute exacerbation and allergic rhinitis Acute exacerbation likely due to weather changes. Discussed Air Supra as an alternative to albuterol  for better inflammation control. Prednisone  taper considered for acute management. Z-Pak deferred unless symptoms persist. - Prescribed Air Supra inhaler as needed, up to four times a day. - Continue Breo once daily. - Prescribed prednisone  taper for five days, to be taken with food. - Hold Z-Pak unless symptoms do not improve with prednisone . - Continue Claritin  as needed for allergic rhinitis.  Chronic right shoulder pain with subacromial impingement Chronic pain with limited range of motion. Previous cortisone injection increased blood pressure. Celebrex  effective for pain management. - Continue physical therapy for shoulder. - Prescribed Celebrex  for flare-ups, with caution due to potential blood pressure elevation.  Essential hypertension Blood pressure well-controlled on losartan  HCTZ. Hydralazine  used as needed for spikes. - Continue losartan  HCTZ 100/12.5 mg daily. - Prescribed hydralazine  as needed for blood pressure spikes.  Obesity, class 1 BMI 31-37. Insurance coverage for Wegovy  confirmed. Discussed dietary modifications for weight loss. - Prescribed Wegovy  for weight management. - Advised high protein diet, including protein shakes and yogurt. - Encouraged continuation of treadmill exercise, 20-30 minutes daily.  Chronic embolism and thrombosis of left lower extremity deep veins Chronic DVT, last episode in 2018. Previously on Xarelto , now on daily aspirin. Discussed ongoing clotting risk and aspirin importance. - Continue daily aspirin  therapy.  General health maintenance Discussed need for routine blood work and colon cancer screening. Rheumatoid factor testing considered due to family history and shoulder pain. - Ordered CBC, comprehensive metabolic panel, and rheumatoid factor  testing. - Scheduled colon cancer screening with fecal immunochemical test (FIT). - Plan for routine blood work in March.

## 2024-10-07 ENCOUNTER — Encounter: Admitting: Family Medicine

## 2025-01-06 ENCOUNTER — Ambulatory Visit: Admitting: Family Medicine
